# Patient Record
Sex: Male | Born: 1935 | Hispanic: No | Marital: Married | Smoking: Never smoker
Health system: Southern US, Community
[De-identification: ages and names within clinical notes are randomized; demographics above are authoritative.]

## PROBLEM LIST (undated history)

## (undated) DIAGNOSIS — E079 Disorder of thyroid, unspecified: Secondary | ICD-10-CM

## (undated) DIAGNOSIS — I10 Essential (primary) hypertension: Secondary | ICD-10-CM

## (undated) HISTORY — DX: Disorder of thyroid, unspecified: E07.9

## (undated) HISTORY — PX: HERNIA REPAIR: SHX51

## (undated) HISTORY — PX: CHOLECYSTECTOMY: SHX55

## (undated) HISTORY — DX: Essential (primary) hypertension: I10

---

## 2012-12-15 ENCOUNTER — Ambulatory Visit
Admission: RE | Admit: 2012-12-15 | Discharge: 2012-12-15 | Disposition: A | Discharge status: Home / Self Care | Payer: Self-pay | Source: Ambulatory Visit | Attending: Family Medicine | Admitting: Family Medicine

## 2012-12-15 ENCOUNTER — Ambulatory Visit: Payer: Self-pay | Attending: Internal Medicine | Admitting: Family Medicine

## 2012-12-15 ENCOUNTER — Encounter: Payer: Self-pay | Admitting: Family Medicine

## 2012-12-15 VITALS — BP 149/90 | HR 57 | Temp 97.7°F | Resp 16 | Ht 66.0 in | Wt 166.0 lb

## 2012-12-15 DIAGNOSIS — I10 Essential (primary) hypertension: Secondary | ICD-10-CM

## 2012-12-15 DIAGNOSIS — Z23 Encounter for immunization: Secondary | ICD-10-CM

## 2012-12-15 DIAGNOSIS — M722 Plantar fascial fibromatosis: Secondary | ICD-10-CM

## 2012-12-15 DIAGNOSIS — E039 Hypothyroidism, unspecified: Secondary | ICD-10-CM

## 2012-12-15 DIAGNOSIS — M79672 Pain in left foot: Secondary | ICD-10-CM

## 2012-12-15 DIAGNOSIS — M79609 Pain in unspecified limb: Secondary | ICD-10-CM | POA: Insufficient documentation

## 2012-12-15 LAB — CBC
HCT: 45.9 % (ref 39.0–52.0)
RDW: 15 % (ref 11.5–15.5)
WBC: 9.3 10*3/uL (ref 4.0–10.5)

## 2012-12-15 MED ORDER — LEVOTHYROXINE SODIUM 50 MCG PO TABS: 50.0000 ug | ORAL_TABLET | Freq: Every day | ORAL | Status: DC

## 2012-12-15 MED ORDER — BISOPROLOL FUMARATE 5 MG PO TABS: 5.0000 mg | ORAL_TABLET | Freq: Every day | ORAL | Status: DC

## 2012-12-15 MED ORDER — LORATADINE 10 MG PO TABS: 10.0000 mg | ORAL_TABLET | Freq: Every day | ORAL | Status: DC

## 2012-12-15 NOTE — Patient Instructions (Addendum)

## 2012-12-15 NOTE — Progress Notes (Signed)
PT HERE TO ESTABLISH CARE FOR HX HTN,THYROID DISEASE TAKING PRESCRIBED MEDS BUT NOT WITH HIM. SON WILL CALL WIFE WITH INFO SPEAKS ONLY ARABIC NEED BLOOD WORK C/O LEFT FOOT INTERMIT PAIN RADIATING TO HEEL X 3 DYS

## 2012-12-15 NOTE — Progress Notes (Signed)
Patient ID: Chavez Rosol, male   DOB: [birth year], 79 y.o.   MRN: [id number]  CC: left foot pain / establish care   Interpreter used to communicate directly with patient for entire encounter including providing detailed patient instructions  HPI: Patient is presenting as a new patient to the clinic today.  [place] with his son.  He reports that he's been having some difficulty with left heel pain.  He reports that he has pain with taking a step first thing in the morning.  He has pain with ambulating after he has been resting for a time.  He reports that he had this condition several years ago but it resolved.  He reports that he takes ibuprofen and that seems to help with the pain.  He has not been able to tolerate naproxen.  The patient also reports that he's had some runny nose and sneezing since he has relocated.  He reports that he normally has control blood pressure which she takes bisoprolol daily.  He reports that he takes the 5 mg dose.  He has a history of hypothyroidism and has been taking a stable dose of levothyroxine for quite a long time.  No Known Allergies Past Medical History  Diagnosis Date  . Thyroid disease   . Hypertension    No current outpatient prescriptions on file prior to visit.   No current facility-administered medications on file prior to visit.   History reviewed. No pertinent family history. History   Social History  . Marital Status: Single    Spouse Name: N/A    Number of Children: N/A  . Years of Education: N/A   Occupational History  . Not on file.   Social History Main Topics  . Smoking status: Never Smoker   . Smokeless tobacco: Not on file  . Alcohol Use: No  . Drug Use: No  . Sexual Activity: Not on file   Other Topics Concern  . Not on file   Social History Narrative  . No narrative on file    Review of Systems  Constitutional: Negative for fever, chills, diaphoresis, activity  change, appetite change and fatigue.  HENT: Negative for ear pain, nosebleeds, congestion, facial swelling, rhinorrhea, neck pain, neck stiffness and ear discharge.   Eyes: Negative for pain, discharge, redness, itching and visual disturbance.  Respiratory: Negative for cough, choking, chest tightness, shortness of breath, wheezing and stridor.   Cardiovascular: Negative for chest pain, palpitations and leg swelling.  Gastrointestinal: Negative for abdominal distention.  Genitourinary: Negative for dysuria, urgency, frequency, hematuria, flank pain, decreased urine volume, difficulty urinating and dyspareunia.  Musculoskeletal: Left foot pain and heel pain.  Negative for back pain, joint swelling, arthralgias and gait problem.  Neurological: Negative for dizziness, tremors, seizures, syncope, facial asymmetry, speech difficulty, weakness, light-headedness, numbness and headaches.  Hematological: Negative for adenopathy. Does not bruise/bleed easily.  Psychiatric/Behavioral: Negative for hallucinations, behavioral problems, confusion, dysphoric mood, decreased concentration and agitation.    Objective:   Filed Vitals:   12/15/12 1235  BP: 149/90  Pulse: 57  Temp: 97.7 F (36.5 C)  Resp: 16    Physical Exam  Constitutional: Appears well-developed and well-nourished. No distress.  HENT: Normocephalic. External right and left ear normal. Oropharynx is clear and moist.  Eyes: Conjunctivae and EOM are normal. PERRLA, no scleral icterus.  Neck: Normal ROM. Neck supple. No JVD. No tracheal deviation. No thyromegaly.  CVS: RRR, S1/S2 +, no murmurs, no gallops,  no carotid bruit.  Pulmonary: Effort and breath sounds normal, no stridor, rhonchi, wheezes, rales.  Abdominal: Soft. BS +,  no distension, tenderness, rebound or guarding.  Musculoskeletal: Painful left heel and left plantar fascia.  Normal range of motion. No edema and no tenderness.  Lymphadenopathy: No lymphadenopathy noted,  cervical, inguinal. Neuro: Alert. Normal reflexes, muscle tone coordination. No cranial nerve deficit. Skin: Skin is warm and dry. No rash noted. Not diaphoretic. No erythema. No pallor.  Psychiatric: Normal mood and affect. Behavior, judgment, thought content normal.   No results found for this basename: WBC, HGB, HCT, MCV, PLT   No results found for this basename: CREATININE, BUN, NA, K, CL, CO2    No results found for this basename: HGBA1C   Lipid Panel  No results found for this basename: chol, trig, hdl, cholhdl, vldl, ldlcalc     Assessment and plan:   Patient Active Problem List   Diagnosis Date Noted  . Plantar fasciitis 12/15/2012  . Unspecified essential hypertension 12/15/2012  . Unspecified hypothyroidism 12/15/2012  . Left foot pain 12/15/2012   I suspect this patient is suffering from plantar fasciitis.  I gave him some information regarding stretching exercises.  I also ordered an x-ray plain film of the left foot to rule out an occult fracture. We'll check labs today including a TSH.  Metabolic panel has been ordered as well.  We'll followup results.  We'll followup the x-ray results.    Return to clinic in 3 weeks for recheck and blood pressure recheck.    Flu vaccine provided today.   The patient was given clear instructions to go to ER or return to medical center if symptoms don't improve, worsen or new problems develop.  The patient verbalized understanding.  The patient was told to call to get any lab results if not heard anything in the next week.    Rodney Langton, MD, CDE, FAAFP [facility] [place], [place]

## 2012-12-16 ENCOUNTER — Other Ambulatory Visit: Payer: Self-pay | Admitting: Family Medicine

## 2012-12-16 DIAGNOSIS — M722 Plantar fascial fibromatosis: Secondary | ICD-10-CM

## 2012-12-16 DIAGNOSIS — M79672 Pain in left foot: Secondary | ICD-10-CM

## 2012-12-16 LAB — COMPLETE METABOLIC PANEL WITH GFR
ALT: 13 U/L (ref 0–53)
AST: 15 U/L (ref 0–37)
Albumin: 3.9 g/dL (ref 3.5–5.2)
Alkaline Phosphatase: 69 U/L (ref 39–117)
BUN: 12 mg/dL (ref 6–23)
Calcium: 9.1 mg/dL (ref 8.4–10.5)
Chloride: 103 mEq/L (ref 96–112)
GFR, Est African American: >89 mL/min
Potassium: 4.6 mEq/L (ref 3.5–5.3)
Sodium: 140 mEq/L (ref 135–145)
Total Protein: 6.6 g/dL (ref 6.0–8.3)

## 2012-12-16 LAB — TSH: TSH: 6.283 u[IU]/mL — ABNORMAL HIGH (ref 0.350–4.500)

## 2012-12-16 NOTE — Progress Notes (Signed)
Quick Note:  Please inform patient of labs came back revealing that the TSH level was mildly elevated. Please make sure the patient hasn't missed any doses of levothyroxine medication. I recommend rechecking TSH level in one month. At that time if it still elevated will need to increase the dose of levothyroxine. All other labs came back within normal limits.  Rodney Langton, MD, CDE, FAAFP [facility] [place], [place]   ______

## 2012-12-16 NOTE — Progress Notes (Signed)
Quick Note:  Please inform patient that x-ray of the left foot revealed the patient has a very large plantar calcaneal spur and mild osteoarthritis in the foot. I will make a referral for him to see the sports medicine center.   Rodney Langton, MD, CDE, FAAFP [facility] [place], [place]   ______

## 2012-12-21 ENCOUNTER — Telehealth: Payer: Self-pay | Admitting: Emergency Medicine

## 2012-12-21 NOTE — Telephone Encounter (Signed)
Message copied by Darlis Loan on Mon Dec 21, 2012  9:52 AM ------      Message from: Cleora Fleet      Created: Wed Dec 16, 2012  9:01 AM       Please inform patient of labs came back revealing that the TSH level was mildly elevated.  Please make sure the patient hasn't missed any doses of levothyroxine medication.  I recommend rechecking TSH level in one month.  At that time if it still elevated will need to increase the dose of levothyroxine.  All other labs came back within normal limits.            Rodney Langton, MD, CDE, FAAFP      [facility]      [facility]      [place], [place]        ------

## 2012-12-21 NOTE — Telephone Encounter (Signed)
Pt son given xray results. States father has appt with sports medicine

## 2012-12-21 NOTE — Telephone Encounter (Signed)
Message copied by Darlis Loan on Mon Dec 21, 2012 11:19 AM ------      Message from: Cleora Fleet      Created: Wed Dec 16, 2012  9:13 AM       Please inform patient that x-ray of the left foot revealed the patient has a very large plantar calcaneal spur and mild osteoarthritis in the foot.  I will make a referral for him to see the sports medicine center.              Rodney Langton, MD, CDE, FAAFP      [facility]      [facility]      [place], [place]        ------

## 2012-12-21 NOTE — Telephone Encounter (Signed)
Spoke with pt father and gave lab results. Son states father is taking Thyroid medication daily.appt already scheduled 01/07/13

## 2013-01-04 ENCOUNTER — Ambulatory Visit: Payer: Self-pay | Admitting: Sports Medicine

## 2013-01-07 ENCOUNTER — Ambulatory Visit: Payer: No Typology Code available for payment source | Attending: Internal Medicine | Admitting: Internal Medicine

## 2013-01-07 ENCOUNTER — Encounter: Payer: Self-pay | Admitting: Internal Medicine

## 2013-01-07 ENCOUNTER — Ambulatory Visit: Payer: Self-pay | Admitting: Internal Medicine

## 2013-01-07 VITALS — BP 119/80 | HR 70 | Temp 97.8°F | Resp 16 | Ht 67.0 in | Wt 164.0 lb

## 2013-01-07 DIAGNOSIS — I10 Essential (primary) hypertension: Secondary | ICD-10-CM | POA: Insufficient documentation

## 2013-01-07 DIAGNOSIS — M722 Plantar fascial fibromatosis: Secondary | ICD-10-CM

## 2013-01-07 DIAGNOSIS — E039 Hypothyroidism, unspecified: Secondary | ICD-10-CM | POA: Insufficient documentation

## 2013-01-07 MED ORDER — LORATADINE 10 MG PO TABS: 10.0000 mg | ORAL_TABLET | Freq: Every day | ORAL | Status: DC

## 2013-01-07 MED ORDER — BISOPROLOL FUMARATE 5 MG PO TABS: 5.0000 mg | ORAL_TABLET | Freq: Every day | ORAL | Status: DC

## 2013-01-07 MED ORDER — LEVOTHYROXINE SODIUM 50 MCG PO TABS: 50.0000 ug | ORAL_TABLET | Freq: Every day | ORAL | Status: DC

## 2013-01-07 NOTE — Progress Notes (Signed)
Pt here f/u thyroid levels with medications due to elevated tsh L foot pain- has scheduled appt with sports med for large spur on xray Son is interpretor for [place] language

## 2013-01-07 NOTE — Patient Instructions (Signed)

## 2013-01-07 NOTE — Progress Notes (Signed)
Patient ID: Anthony Hammond, male   DOB: 19-Oct-1929, 79 y.o.   MRN: [id number] Patient Demographics  Anthony Hammond, is a 79 y.o. male  WUJ:[id number]  [id number]  DOB - [birth year]  Chief Complaint  Patient presents with  . Follow-up  . Hypothyroidism        Subjective:   Anthony Hammond is a 79 y.o. male here today for a follow up visit. Patient has no new complaints, still awaiting sport medicine appointment. Patient claims pain is much improved. He needs a refill of his medications today, and to discuss the results of previous lab tests. She claims to be doing well at home, no sleeping, walking or eating difficulties. He does not smoke cigarette. Patient has No headache, No chest pain, No abdominal pain - No Nausea, No new weakness tingling or numbness, No Cough - SOB.  ALLERGIES: No Known Allergies  PAST MEDICAL HISTORY: Past Medical History  Diagnosis Date  . Thyroid disease   . Hypertension     MEDICATIONS AT HOME: Prior to Admission medications   Medication Sig Start Date End Date Taking? Authorizing Provider  bisoprolol (ZEBETA) 5 MG tablet Take 1 tablet (5 mg total) by mouth daily. 01/07/13  Yes Jeanann Lewandowsky, MD  levothyroxine (SYNTHROID, LEVOTHROID) 50 MCG tablet Take 1 tablet (50 mcg total) by mouth daily. 01/07/13  Yes Jeanann Lewandowsky, MD  loratadine (CLARITIN) 10 MG tablet Take 1 tablet (10 mg total) by mouth daily. 01/07/13  Yes Jeanann Lewandowsky, MD     Objective:   Filed Vitals:   01/07/13 1223  BP: 119/80  Pulse: 70  Temp: 97.8 F (36.6 C)  TempSrc: Oral  Resp: 16  Height: 5\' 7"  (1.702 m)  Weight: 164 lb (74.39 kg)  SpO2: 96%    Exam General appearance : Awake, alert, not in any distress. Speech Clear. Not toxic looking HEENT: Atraumatic and Normocephalic, pupils equally reactive to light and accomodation Neck: supple, no JVD. No cervical lymphadenopathy.  Chest:Good air entry bilaterally, no added sounds  CVS: S1 S2  regular, no murmurs.  Abdomen: Bowel sounds present, Non tender and not distended with no gaurding, rigidity or rebound. Extremities: B/L Lower Ext shows no edema, both legs are warm to touch Neurology: Awake alert, and oriented X 3, CN II-XII intact, Non focal Skin:No Rash Wounds:N/A   Data Review   CBC No results found for this basename: WBC, HGB, HCT, PLT, MCV, MCH, MCHC, RDW, NEUTRABS, LYMPHSABS, MONOABS, EOSABS, BASOSABS, BANDABS, BANDSABD,  in the last 168 hours  Chemistries   No results found for this basename: NA, K, CL, CO2, GLUCOSE, BUN, CREATININE, GFRCGP, CALCIUM, MG, AST, ALT, ALKPHOS, BILITOT,  in the last 168 hours ------------------------------------------------------------------------------------------------------------------ No results found for this basename: HGBA1C,  in the last 72 hours ------------------------------------------------------------------------------------------------------------------ No results found for this basename: CHOL, HDL, LDLCALC, TRIG, CHOLHDL, LDLDIRECT,  in the last 72 hours ------------------------------------------------------------------------------------------------------------------ No results found for this basename: TSH, T4TOTAL, FREET3, T3FREE, THYROIDAB,  in the last 72 hours ------------------------------------------------------------------------------------------------------------------ No results found for this basename: VITAMINB12, FOLATE, FERRITIN, TIBC, IRON, RETICCTPCT,  in the last 72 hours  Coagulation profile  No results found for this basename: INR, PROTIME,  in the last 168 hours    Assessment & Plan   Patient Active Problem List   Diagnosis Date Noted  . Plantar fasciitis 12/15/2012  . Unspecified essential hypertension 12/15/2012  . Unspecified hypothyroidism 12/15/2012  . Left foot pain 12/15/2012     Plan: Refill the following medications: Bisoprolol 5 mg tablet  by mouth daily Levothyroxine 50 mcg  tablet by mouth daily Loratadine 10 mg tablet by mouth daily  Patient has been counseled extensively about nutrition and exercise  Health Maintenance -Colonoscopy: Deferred -Vaccinations:  -Influenza already given  Follow up in 3 months or when necessary  Interpreter was used to communicate directly with patient for the entire encounter including providing detailed patient instructions.   The patient was given clear instructions to go to ER or return to medical center if symptoms don't improve, worsen or new problems develop. The patient verbalized understanding. The patient was told to call to get lab results if they haven't heard anything in the next week.    Jeanann Lewandowsky, MD, MHA, FACP, FAAP [facility] [place], [place] [phone]   01/07/2013, 12:54 PM

## 2013-02-22 ENCOUNTER — Emergency Department (HOSPITAL_COMMUNITY)
Admission: EM | Admit: 2013-02-22 | Discharge: 2013-02-22 | Disposition: A | Discharge status: Home / Self Care | Payer: No Typology Code available for payment source | Source: Home / Self Care | Attending: Family Medicine | Admitting: Family Medicine

## 2013-02-22 ENCOUNTER — Encounter (HOSPITAL_COMMUNITY): Payer: Self-pay | Admitting: Emergency Medicine

## 2013-02-22 DIAGNOSIS — K279 Peptic ulcer, site unspecified, unspecified as acute or chronic, without hemorrhage or perforation: Secondary | ICD-10-CM

## 2013-02-22 DIAGNOSIS — A048 Other specified bacterial intestinal infections: Secondary | ICD-10-CM

## 2013-02-22 MED ORDER — CLARITHROMYCIN 500 MG PO TABS: 500.0000 mg | ORAL_TABLET | Freq: Two times a day (BID) | ORAL | Status: DC

## 2013-02-22 MED ORDER — OMEPRAZOLE 20 MG PO CPDR: 20.0000 mg | DELAYED_RELEASE_CAPSULE | Freq: Two times a day (BID) | ORAL | Status: DC

## 2013-02-22 MED ORDER — AMOXICILLIN 500 MG PO CAPS: 1000.0000 mg | ORAL_CAPSULE | Freq: Two times a day (BID) | ORAL | Status: DC

## 2013-02-22 NOTE — ED Notes (Signed)
C/o abd pain States he had a heel spur and was given naproxen from his son and feels like the sx started then States a year ago he was dx with a hernia Denies vomiting, diarrhea, and appetite States he is unable to lay down with out pain

## 2013-02-22 NOTE — ED Provider Notes (Signed)
CSN: [id number]     Arrival date & time 02/22/13  0935 History   None    Chief Complaint  Patient presents with  . Abdominal Pain   (Consider location/radiation/quality/duration/timing/severity/associated sxs/prior Treatment) HPI Comments: Pt with epigastric abd pain for 4 weeks. Started when he took some naproxen for heel pain. Only took med for 2 days because it caused abd pain. Pain is constant, but worse with eating and lying supine. [place], tx with omeprazole for 3 months for complete resolution of sx.   Patient is a 79 y.o. male presenting with abdominal pain. The history is provided by the patient and a relative. A language interpreter was used (son interpreted for pt).  Abdominal Pain This is a new problem. Episode onset: 4 weeks ago. The problem occurs constantly. The problem has not changed since onset.Associated symptoms include abdominal pain. The symptoms are aggravated by eating (lying down). Nothing relieves the symptoms. He has tried nothing for the symptoms.    Past Medical History  Diagnosis Date  . Thyroid disease   . Hypertension    Past Surgical History  Procedure Laterality Date  . Cholecystectomy     History reviewed. No pertinent family history. History  Substance Use Topics  . Smoking status: Never Smoker   . Smokeless tobacco: Not on file  . Alcohol Use: No    Review of Systems  Constitutional: Negative for fever and chills.  Gastrointestinal: Positive for abdominal pain and constipation. Negative for nausea, vomiting, diarrhea and blood in stool.    Allergies  Review of patient's allergies indicates no known allergies.  Home Medications   Current Outpatient Rx  Name  Route  Sig  Dispense  Refill  . amoxicillin (AMOXIL) 500 MG capsule   Oral   Take 2 capsules (1,000 mg total) by mouth 2 (two) times daily.   56 capsule   0   . bisoprolol (ZEBETA) 5 MG tablet   Oral   Take 1 tablet (5 mg total) by mouth  daily.   90 tablet   3   . clarithromycin (BIAXIN) 500 MG tablet   Oral   Take 1 tablet (500 mg total) by mouth 2 (two) times daily.   28 tablet   0   . levothyroxine (SYNTHROID, LEVOTHROID) 50 MCG tablet   Oral   Take 1 tablet (50 mcg total) by mouth daily.   90 tablet   3   . loratadine (CLARITIN) 10 MG tablet   Oral   Take 1 tablet (10 mg total) by mouth daily.   30 tablet   3   . omeprazole (PRILOSEC) 20 MG capsule   Oral   Take 1 capsule (20 mg total) by mouth 2 (two) times daily before a meal.   60 capsule   2    BP 137/87  Pulse 75  Temp(Src) 98.4 F (36.9 C) (Oral)  Resp 20  SpO2 96% Physical Exam  Constitutional: He appears well-developed and well-nourished. No distress.  Cardiovascular: Normal rate and regular rhythm.   Pulmonary/Chest: Effort normal and breath sounds normal.  Abdominal: Normal appearance and bowel sounds are normal. He exhibits no distension. There is no hepatosplenomegaly. There is tenderness in the right upper quadrant, epigastric area and left upper quadrant. There is no rigidity, no rebound and no guarding.  Pain is primarily epigastric, but pt also feels some discomfort in BUQ    ED Course  Procedures (including critical care time) Labs Review  Labs Reviewed  POCT H PYLORI SCREEN - Abnormal; Notable for the following:    H. PYLORI SCREEN, POC POSITIVE (*)    All other components within normal limits   Imaging Review No results found.  EKG Interpretation    Date/Time:    Ventricular Rate:    PR Interval:    QRS Duration:   QT Interval:    QTC Calculation:   R Axis:     Text Interpretation:              MDM   1. Peptic ulcer disease   2. H. pylori infection   rx clarithyromycin 500mg  BID #28, amoxicillin 500mg  2 po BID #56 and omeprazole 20mg  BID #60 2 refills. Pt to f/u with pcp in one month for recheck.      Cathlyn Parsons, NP 02/22/13 1214

## 2013-02-23 NOTE — ED Provider Notes (Signed)
Medical screening examination/treatment/procedure(s) were performed by a resident physician or non-physician practitioner and as the supervising physician I was immediately available for consultation/collaboration.  Clementeen Graham, MD   Rodolph Bong, MD 02/23/13 [phone]

## 2013-04-12 ENCOUNTER — Ambulatory Visit: Payer: No Typology Code available for payment source | Attending: Internal Medicine | Admitting: Internal Medicine

## 2013-04-12 ENCOUNTER — Encounter: Payer: Self-pay | Admitting: Internal Medicine

## 2013-04-12 ENCOUNTER — Ambulatory Visit: Payer: Self-pay | Admitting: Internal Medicine

## 2013-04-12 VITALS — BP 135/84 | HR 91 | Temp 98.4°F | Resp 16 | Wt 160.0 lb

## 2013-04-12 DIAGNOSIS — E039 Hypothyroidism, unspecified: Secondary | ICD-10-CM

## 2013-04-12 DIAGNOSIS — R1013 Epigastric pain: Secondary | ICD-10-CM

## 2013-04-12 DIAGNOSIS — I10 Essential (primary) hypertension: Secondary | ICD-10-CM

## 2013-04-12 NOTE — Progress Notes (Signed)
Patient here for follow up States was at the urgent care about 6 weeks ago Diagnosed with and ulcer Has been having similar symptoms again- stomach pain and Decreased appetite Will need referral to GI

## 2013-04-12 NOTE — Progress Notes (Signed)
MRN: [id number] Name: Anthony Hammond  Sex: male Age: 80 y.o. DOB: 1929-09-27  Allergies: Review of patient's allergies indicates no known allergies.  Chief Complaint  Patient presents with  . Follow-up    HPI: Patient is 80 y.o. male who comes today for followup history of hypertension hypothyroidism and has been taking his medications, 6 weeks ago he was seen in the urgent care with symptoms of epigastric pain  found to have H. pylori positive, already treated with 2 weeks of triple regimen, as per patient after finishing medication he gets symptoms more often, he had refill on Prilosec but did not get prescription filled, denies any change in bowel habits.  Past Medical History  Diagnosis Date  . Thyroid disease   . Hypertension     Past Surgical History  Procedure Laterality Date  . Cholecystectomy        Medication List       This list is accurate as of: 04/12/13  5:24 PM.  Always use your most recent med list.               amoxicillin 500 MG capsule  Commonly known as:  AMOXIL  Take 2 capsules (1,000 mg total) by mouth 2 (two) times daily.     bisoprolol 5 MG tablet  Commonly known as:  ZEBETA  Take 1 tablet (5 mg total) by mouth daily.     clarithromycin 500 MG tablet  Commonly known as:  BIAXIN  Take 1 tablet (500 mg total) by mouth 2 (two) times daily.     levothyroxine 50 MCG tablet  Commonly known as:  SYNTHROID, LEVOTHROID  Take 1 tablet (50 mcg total) by mouth daily.     loratadine 10 MG tablet  Commonly known as:  CLARITIN  Take 1 tablet (10 mg total) by mouth daily.     omeprazole 20 MG capsule  Commonly known as:  PRILOSEC  Take 1 capsule (20 mg total) by mouth 2 (two) times daily before a meal.        No orders of the defined types were placed in this encounter.    Immunization History  Administered Date(s) Administered  . Influenza Split 12/15/2012    History reviewed. No pertinent family history.  History  Substance Use  Topics  . Smoking status: Never Smoker   . Smokeless tobacco: Not on file  . Alcohol Use: No    Review of Systems  As noted in HPI  Filed Vitals:   04/12/13 1703  BP: 135/84  Pulse: 91  Temp: 98.4 F (36.9 C)  Resp: 16    Physical Exam  Physical Exam  Eyes: EOM are normal. Pupils are equal, round, and reactive to light.  Cardiovascular: Normal rate and regular rhythm.   Pulmonary/Chest: Breath sounds normal. No respiratory distress. He has no wheezes.  Abdominal:  Minimal epigastric tenderness no rebound or guarding bowel sounds positive  Neurological: He has normal reflexes.    CBC    Component Value Date/Time   WBC 9.3 12/15/2012 1257   RBC 5.43 12/15/2012 1257   HGB 15.6 12/15/2012 1257   HCT 45.9 12/15/2012 1257   PLT 255 12/15/2012 1257   MCV 84.5 12/15/2012 1257    CMP     Component Value Date/Time   NA 140 12/15/2012 1257   K 4.6 12/15/2012 1257   CL 103 12/15/2012 1257   CO2 30 12/15/2012 1257   GLUCOSE 84 12/15/2012 1257   BUN 12 12/15/2012 1257   CREATININE  0.89 12/15/2012 1257   CALCIUM 9.1 12/15/2012 1257   PROT 6.6 12/15/2012 1257   ALBUMIN 3.9 12/15/2012 1257   AST 15 12/15/2012 1257   ALT 13 12/15/2012 1257   ALKPHOS 69 12/15/2012 1257   BILITOT 0.4 12/15/2012 1257    No results found for this basename: chol, tri, ldl    No components found with this basename: hga1c    Lab Results  Component Value Date/Time   AST 15 12/15/2012 12:57 PM    Assessment and Plan  Abdominal pain, epigastric - Plan: Patient will continued Prilosec  Ambulatory referral to Gastroenterology  Unspecified hypothyroidism - Plan: TSH Continue with levothyroxine 50 mcg daily.  Unspecified essential hypertension Blood pressure is well controlled continue with bisoprolol. Also advised for low-salt diet.   Return in about 3 months (around 07/11/2013).  Doris CheadleADVANI, Ersa Delaney, MD

## 2013-04-13 ENCOUNTER — Telehealth: Payer: Self-pay | Admitting: *Deleted

## 2013-04-13 LAB — TSH: TSH: 3.998 u[IU]/mL (ref 0.350–4.500)

## 2013-04-13 NOTE — Telephone Encounter (Signed)
Message copied by Raynelle CharyWINFREE, Riven Mabile R on Tue Apr 13, 2013 11:17 AM ------      Message from: Doris CheadleADVANI, DEEPAK      Created: Tue Apr 13, 2013 11:03 AM       Blood work reviewed, TSH level is within normal range, call and advise patient to continue with same dose of levothyroxine 50 mcg daily. ------

## 2013-04-13 NOTE — Telephone Encounter (Signed)
I called pt and informed him of his lab results. Pt was also concerned about the test we did showing what could be his stomach problems. I told the pt that the doctor has not review that test yet. I told him as soon as the doctor reviews the test that I would give him another call.

## 2013-04-14 ENCOUNTER — Telehealth: Payer: Self-pay

## 2013-04-14 ENCOUNTER — Encounter: Payer: Self-pay | Admitting: Gastroenterology

## 2013-04-14 NOTE — Telephone Encounter (Signed)
Patient is aware of her lab results 

## 2013-04-14 NOTE — Telephone Encounter (Signed)
Message copied by Lestine MountJUAREZ, Foxx Klarich L on Wed Apr 14, 2013  5:01 PM ------      Message from: Doris CheadleADVANI, DEEPAK      Created: Tue Apr 13, 2013 11:03 AM       Blood work reviewed, TSH level is within normal range, call and advise patient to continue with same dose of levothyroxine 50 mcg daily. ------

## 2013-04-19 ENCOUNTER — Ambulatory Visit: Payer: No Typology Code available for payment source | Attending: Internal Medicine

## 2013-05-11 ENCOUNTER — Ambulatory Visit (INDEPENDENT_AMBULATORY_CARE_PROVIDER_SITE_OTHER): Payer: No Typology Code available for payment source | Admitting: Gastroenterology

## 2013-05-11 ENCOUNTER — Encounter: Payer: Self-pay | Admitting: Gastroenterology

## 2013-05-11 VITALS — BP 110/54 | HR 70 | Ht 67.0 in | Wt 160.0 lb

## 2013-05-11 DIAGNOSIS — R1013 Epigastric pain: Secondary | ICD-10-CM

## 2013-05-11 DIAGNOSIS — G8929 Other chronic pain: Secondary | ICD-10-CM

## 2013-05-11 NOTE — Progress Notes (Signed)
HPI: This is a    very pleasant [place] man who is here with his son today.  He presented to urgent clinic 02/2013, blood test + H. Pylori serology and he was put on abx.  He was having epigastric pains, some mild nausea. The pains would even awaken him at night.  About a year ago in [place] he was told he had an ulcer and a hernia (hiatal?).    Bone spur in heel.  Son gave him NSAIDs, 3 months ago; ibuprofen for a day or two, then naproxen for  Days. No other OTC pain meds.  He began to have epigastric pain in November.  H. Pylori treatment and PPI BID.  He stopped the PPI, pain returned.  Then restarted PPI once daily 3-4 weeks ago and he feels abosolutely fine.  Overall his weight has been stable.  No vomiting.  No dypshagia.    Pyrosis occasionally.    Has seen no overt GI bleeding.    Review of systems: Pertinent positive and negative review of systems were noted in the above HPI section. Complete review of systems was performed and was otherwise normal.    Past Medical History  Diagnosis Date  . Thyroid disease   . Hypertension     Past Surgical History  Procedure Laterality Date  . Cholecystectomy      Current Outpatient Prescriptions  Medication Sig Dispense Refill  . bisoprolol (ZEBETA) 5 MG tablet Take 1 tablet (5 mg total) by mouth daily.  90 tablet  3  . levothyroxine (SYNTHROID, LEVOTHROID) 50 MCG tablet Take 1 tablet (50 mcg total) by mouth daily.  90 tablet  3  . omeprazole (PRILOSEC) 20 MG capsule Take 1 capsule (20 mg total) by mouth 2 (two) times daily before a meal.  60 capsule  2   No current facility-administered medications for this visit.    Allergies as of 05/11/2013  . (No Known Allergies)    Family History  Problem Relation Age of Onset  . Celiac disease Son   . Diabetes Brother     History   Social History  . Marital Status: Married    Spouse Name: N/A    Number of Children: N/A  . Years of Education: N/A   Occupational  History  . Not on file.   Social History Main Topics  . Smoking status: Never Smoker   . Smokeless tobacco: Not on file  . Alcohol Use: No  . Drug Use: No  . Sexual Activity: Not on file   Other Topics Concern  . Not on file   Social History Narrative  . No narrative on file       Physical Exam: BP 110/54  Pulse 70  Ht 5\' 7"  (1.702 m)  Wt 160 lb (72.576 kg)  BMI 25.05 kg/m2 Constitutional: generally well-appearing Psychiatric: alert and oriented x3 Eyes: extraocular movements intact Mouth: oral pharynx moist, no lesions Neck: supple no lymphadenopathy Cardiovascular: heart regular rate and rhythm Lungs: clear to auscultation bilaterally Abdomen: soft, nontender, nondistended, no obvious ascites, no peritoneal signs, normal bowel sounds Extremities: no lower extremity edema bilaterally Skin: no lesions on visible extremities    Assessment and plan: 80 y.o. male with  epigastric pain and proved after H. pylori treatment and daily proton pump inhibitor  I suspect he does have or did have gastritis, perhaps peptic ulcer disease as well. He is not interested in any further testing at this point. He feels well on proton pump inhibitor once  daily, he is completely back to normal. He is not losing weight, has no signs of GI bleeding. I think it is fine to simply treat him with proton pump inhibitor but he knows to call here if the pains return at that point I would likely recommend we proceed with upper endoscopy.

## 2013-05-11 NOTE — Patient Instructions (Signed)
You should keep taking omeprazole one pill once daily. If your pains return, please call and would likely proceed with upper endoscopy.

## 2013-07-13 ENCOUNTER — Ambulatory Visit: Payer: Self-pay

## 2014-05-05 ENCOUNTER — Other Ambulatory Visit: Payer: Self-pay | Admitting: Internal Medicine

## 2014-05-11 ENCOUNTER — Telehealth: Payer: Self-pay | Admitting: Internal Medicine

## 2014-05-11 NOTE — Telephone Encounter (Signed)
Patient has come n today to schedule an appointment with a provider to re-establish care of his HBP; Patient is out of two prescriptions bisoprolol (ZEBETA) 5 MG tablet & omeprazole (PRILOSEC) 20 MG capsule; please f/u with patient

## 2014-05-12 ENCOUNTER — Ambulatory Visit: Payer: Self-pay | Attending: Family Medicine | Admitting: Family Medicine

## 2014-05-12 ENCOUNTER — Encounter: Payer: Self-pay | Admitting: Family Medicine

## 2014-05-12 VITALS — BP 115/77 | HR 76 | Temp 97.8°F | Resp 16 | Ht 66.5 in | Wt 154.0 lb

## 2014-05-12 DIAGNOSIS — M25511 Pain in right shoulder: Secondary | ICD-10-CM | POA: Insufficient documentation

## 2014-05-12 DIAGNOSIS — Z0000 Encounter for general adult medical examination without abnormal findings: Secondary | ICD-10-CM

## 2014-05-12 DIAGNOSIS — Z23 Encounter for immunization: Secondary | ICD-10-CM | POA: Insufficient documentation

## 2014-05-12 DIAGNOSIS — K0889 Other specified disorders of teeth and supporting structures: Secondary | ICD-10-CM

## 2014-05-12 DIAGNOSIS — I10 Essential (primary) hypertension: Secondary | ICD-10-CM | POA: Insufficient documentation

## 2014-05-12 DIAGNOSIS — K088 Other specified disorders of teeth and supporting structures: Secondary | ICD-10-CM | POA: Insufficient documentation

## 2014-05-12 MED ORDER — ACETAMINOPHEN ER 650 MG PO TBCR: 650.0000 mg | EXTENDED_RELEASE_TABLET | Freq: Three times a day (TID) | ORAL | Status: DC | PRN

## 2014-05-12 MED ORDER — ZOSTER VACCINE LIVE 19400 UNT/0.65ML ~~LOC~~ SOLR: 0.6500 mL | Freq: Once | SUBCUTANEOUS | Status: DC

## 2014-05-12 MED ORDER — OMEPRAZOLE 20 MG PO CPDR: 20.0000 mg | DELAYED_RELEASE_CAPSULE | Freq: Two times a day (BID) | ORAL | Status: DC

## 2014-05-12 MED ORDER — BISOPROLOL FUMARATE 5 MG PO TABS: 5.0000 mg | ORAL_TABLET | Freq: Every day | ORAL | Status: DC

## 2014-05-12 NOTE — Assessment & Plan Note (Signed)
A: hx of HTN, normotensive on BB. No history of MI or CAD Med: compliant P: We discussed that BB is not first line treatment but patient would like to continue Refilled low dose BB

## 2014-05-12 NOTE — Progress Notes (Signed)
PI ID # [id number]  Patient here to f/u on his HTN Patient complains of left lower tooth pain patient felt something "break off" when he was eating Patient's son reports patient had abscess and his relatives gave him clamoxyl that they had left over Patient needs refill on Prilosec

## 2014-05-12 NOTE — Patient Instructions (Addendum)
Mr. Anthony Hammond,  Thank you for coming in today. It was a pleasure meeting you. I look forward to being your primary doctor.   1. Dental pain: no abscess currently. Referral placed.  2. L shoulder pain: I suspect bursitis or DJD Recommend tylenol 650 mg every 8 hrs as needed.  Sports medicine referral  3. HTN:  BP well controlled  refilled bisoprolol   4. Healthcare maintenance Flu and prevnar 13 today, prevnar 23 in 6-12 months  GI referral for screening colonoscopy  zostavax prescription  F/u in 6 months for prevnar 23., nurse visit. See me in one year for HTN.   Dr. Armen PickupFunches

## 2014-05-12 NOTE — Assessment & Plan Note (Signed)
A: due for colonoscopy, flu, pneumovax (unsure of immunization status), zostavax  P: Flu and prevnar 13 today, prevnar 23 in 6-12 months  GI referral for screening colonoscopy  zostavax prescription

## 2014-05-12 NOTE — Assessment & Plan Note (Signed)
A: R shoulder pain with adduction suspect bursitis vs DJD P: Tylenol 650 mg TID prn [facility] and possible shoulder injection

## 2014-05-13 NOTE — Assessment & Plan Note (Signed)
1. Dental pain: no abscess currently. Referral placed.

## 2014-05-13 NOTE — Progress Notes (Signed)
   Subjective:    Patient ID: Anthony Hammond, male    DOB: 08/22/1929, 81 y.o.   MRN: [id number] CC: HTN f/u tooth pain, shoulder pain  HPI 81 yo M:  1. HTN: compliant with BB. No fatigue, CP, SOB.   2. Chipped tooth: a few weeks ago. No oral swelling or fever. No dental home.  3. L shoulder pain: x many years. Worse with lifting arm. Limiting ROM. No trauma. No home therapy.   Soc Hx: non smoker  Review of Systems As per HPI     Objective:   Physical Exam BP 115/77 mmHg  Pulse 76  Temp(Src) 97.8 F (36.6 C)  Resp 16  Ht 5' 6.5" (1.689 m)  Wt 154 lb (69.854 kg)  BMI 24.49 kg/m2  SpO2 96% General appearance: alert, cooperative and no distress Throat: lower partials, poor dentition, chipped tooth  Neck: no adenopathy, supple, symmetrical, trachea midline and thyroid not enlarged, symmetric, no tenderness/mass/nodules Lungs: clear to auscultation bilaterally Heart: regular rate and rhythm, S1, S2 normal, no murmur, click, rub or gallop Extremities: extremities normal, atraumatic, no cyanosis or edema  Pain with adduction of shoulders        Assessment & Plan:

## 2014-05-25 ENCOUNTER — Ambulatory Visit: Payer: Self-pay | Admitting: Sports Medicine

## 2014-05-26 ENCOUNTER — Ambulatory Visit: Payer: Self-pay | Attending: Internal Medicine

## 2014-07-20 ENCOUNTER — Other Ambulatory Visit: Payer: Self-pay | Admitting: *Deleted

## 2014-07-20 MED ORDER — OMEPRAZOLE 20 MG PO CPDR: 20.0000 mg | DELAYED_RELEASE_CAPSULE | Freq: Two times a day (BID) | ORAL | Status: DC

## 2014-09-05 ENCOUNTER — Ambulatory Visit: Payer: Self-pay | Attending: Family Medicine | Admitting: Family Medicine

## 2014-09-05 ENCOUNTER — Encounter: Payer: Self-pay | Admitting: Family Medicine

## 2014-09-05 VITALS — BP 135/81 | HR 72 | Temp 98.0°F | Resp 16 | Ht 66.5 in | Wt 153.0 lb

## 2014-09-05 DIAGNOSIS — R109 Unspecified abdominal pain: Secondary | ICD-10-CM | POA: Insufficient documentation

## 2014-09-05 DIAGNOSIS — M26629 Arthralgia of temporomandibular joint, unspecified side: Secondary | ICD-10-CM | POA: Insufficient documentation

## 2014-09-05 DIAGNOSIS — M25511 Pain in right shoulder: Secondary | ICD-10-CM

## 2014-09-05 DIAGNOSIS — R1032 Left lower quadrant pain: Secondary | ICD-10-CM

## 2014-09-05 DIAGNOSIS — M2660 Temporomandibular joint disorder, unspecified: Secondary | ICD-10-CM | POA: Insufficient documentation

## 2014-09-05 DIAGNOSIS — M2669 Other specified disorders of temporomandibular joint: Secondary | ICD-10-CM

## 2014-09-05 MED ORDER — ACETAMINOPHEN ER 650 MG PO TBCR: 650.0000 mg | EXTENDED_RELEASE_TABLET | Freq: Three times a day (TID) | ORAL | Status: AC | PRN

## 2014-09-05 MED ORDER — IBUPROFEN 600 MG PO TABS: 600.0000 mg | ORAL_TABLET | Freq: Three times a day (TID) | ORAL | Status: DC | PRN

## 2014-09-05 NOTE — Patient Instructions (Signed)
Mr. Tolmie,  Thank you for coming in today  1. L jaw pain: TMJ Short course of ibuprofen Soft diet- avoid hard foods Referral to dentist placed again   2. L lower abdominal pain with walking: I do not feel a definite hernia Tylenol for pain If you develop bulging or severe pain I will obtain imaging and refer to surgery   F/u in 3 months for hypertension, hypothyroidism and flu shot   Dr. Armen Pickup   Temporomandibular Problems  Temporomandibular joint (TMJ) dysfunction means there are problems with the joint between your jaw and your skull. This is a joint lined by cartilage like other joints in your body but also has a small disc in the joint which keeps the bones from rubbing on each other. These joints are like other joints and can get inflamed (sore) from arthritis and other problems. When this joint gets sore, it can cause headaches and pain in the jaw and the face. CAUSES  Usually the arthritic types of problems are caused by soreness in the joint. Soreness in the joint can also be caused by overuse. This may come from grinding your teeth. It may also come from mis-alignment in the joint. DIAGNOSIS Diagnosis of this condition can often be made by history and exam. Sometimes your caregiver may need X-rays or an MRI scan to determine the exact cause. It may be necessary to see your dentist to determine if your teeth and jaws are lined up correctly. TREATMENT  Most of the time this problem is not serious; however, sometimes it can persist (become chronic). When this happens medications that will cut down on inflammation (soreness) help. Sometimes a shot of cortisone into the joint will be helpful. If your teeth are not aligned it may help for your dentist to make a splint for your mouth that can help this problem. If no physical problems can be found, the problem may come from tension. If tension is found to be the cause, biofeedback or relaxation techniques may be helpful. HOME CARE  INSTRUCTIONS   Later in the day, applications of ice packs may be helpful. Ice can be used in a plastic bag with a towel around it to prevent frostbite to skin. This may be used about every 2 hours for 20 to 30 minutes, as needed while awake, or as directed by your caregiver.  Only take over-the-counter or prescription medicines for pain, discomfort, or fever as directed by your caregiver.  If physical therapy was prescribed, follow your caregiver's directions.  Wear mouth appliances as directed if they were given. Document Released: 11/27/2000 Document Revised: 05/27/2011 Document Reviewed: 03/06/2008 [facility] Patient Information 2015 [place], [place]. This information is not intended to replace advice given to you by your health care provider. Make sure you discuss any questions you have with your health care provider.

## 2014-09-05 NOTE — Progress Notes (Signed)
F/U HTN Complaining jaw pain x 10 days Hx Bell Palsy  Hip area pain worsen with walk x 5 month hx hernia    Used [employer] Interpreted Arabic Virgel Bouquet

## 2014-09-05 NOTE — Assessment & Plan Note (Signed)
L jaw pain: TMJ Short course of ibuprofen Soft diet- avoid hard foods Referral to dentist placed again

## 2014-09-05 NOTE — Assessment & Plan Note (Signed)
L lower abdominal pain with walking: I do not feel a definite hernia Tylenol for pain If you develop bulging or severe pain I will obtain imaging and refer to surgery

## 2014-09-05 NOTE — Progress Notes (Signed)
   Subjective:    Patient ID: Anthony Hammond, male    DOB: [birth year], 81 y.o.   MRN: [id number] CC: jaw pain and hip pain  Arabic interpreter present  HPI  1. L sided jaw pain: x 10 days. No injury. No neck pain. Some clicking. Has not seen dentist. No jaw swelling.   2. LL abdominal pain: pain off and on in LLQ with walking. No bulging. Has hx of RLQ hernia s/p repair. No injury.   Soc Hx: non smoker  Review of Systems  HENT: Positive for dental problem. Negative for ear discharge and ear pain.   Gastrointestinal: Positive for abdominal pain. Negative for nausea, vomiting, diarrhea, constipation and abdominal distention.      Objective:   Physical Exam BP 135/81 mmHg  Pulse 72  Temp(Src) 98 F (36.7 C) (Oral)  Resp 16  Ht 5' 6.5" (1.689 m)  Wt 153 lb (69.4 kg)  BMI 24.33 kg/m2  SpO2 97%  Wt Readings from Last 3 Encounters:  09/05/14 153 lb (69.4 kg)  05/12/14 154 lb (69.854 kg)  05/11/13 160 lb (72.576 kg)  General appearance: alert, cooperative and no distress  Ears: wax in R canal, normal TM. Normal L canal and TM Throat: normal findings: oropharynx pink & moist without lesions or evidence of thrush and abnormal findings: dentition: poor  Neck: no adenopathy, supple, symmetrical, trachea midline and thyroid not enlarged, symmetric, no tenderness/mass/nodules Abdomen: healed RL abdomen surgical scar, LL abdomen, no mass or bulging. Softness in L lower abdominal wall.      Assessment & Plan:

## 2014-09-29 ENCOUNTER — Other Ambulatory Visit: Payer: Self-pay | Admitting: Internal Medicine

## 2014-09-29 ENCOUNTER — Other Ambulatory Visit: Payer: Self-pay | Admitting: *Deleted

## 2014-09-29 MED ORDER — LEVOTHYROXINE SODIUM 50 MCG PO TABS: 50.0000 ug | ORAL_TABLET | Freq: Every day | ORAL | Status: DC

## 2014-09-29 MED ORDER — OMEPRAZOLE 20 MG PO CPDR: 20.0000 mg | DELAYED_RELEASE_CAPSULE | Freq: Two times a day (BID) | ORAL | Status: DC

## 2014-09-29 NOTE — Telephone Encounter (Signed)
Patient's son came into facility to request med refills for bisoprolol (ZEBETA) 5 MG tablet, levothyroxine (SYNTHROID, LEVOTHROID) 50 MCG tablet, and omeprazole (PRILOSEC) 20 MG capsule. Please f/u with pt.

## 2014-09-30 ENCOUNTER — Other Ambulatory Visit: Payer: Self-pay | Admitting: Family Medicine

## 2014-09-30 DIAGNOSIS — E039 Hypothyroidism, unspecified: Secondary | ICD-10-CM

## 2014-10-06 ENCOUNTER — Ambulatory Visit: Payer: Self-pay | Attending: Family Medicine

## 2014-10-06 DIAGNOSIS — E039 Hypothyroidism, unspecified: Secondary | ICD-10-CM

## 2014-10-06 LAB — TSH: TSH: 6.284 u[IU]/mL — ABNORMAL HIGH (ref 0.350–4.500)

## 2014-10-07 ENCOUNTER — Other Ambulatory Visit: Payer: Self-pay | Admitting: Family Medicine

## 2014-10-07 DIAGNOSIS — E039 Hypothyroidism, unspecified: Secondary | ICD-10-CM

## 2014-10-07 MED ORDER — LEVOTHYROXINE SODIUM 75 MCG PO TABS: 75.0000 ug | ORAL_TABLET | Freq: Every day | ORAL | Status: DC

## 2014-10-07 NOTE — Assessment & Plan Note (Signed)
TSH elevated  Increase synthroid to 75 mcg daily  Repeat TSH in 12 weeks, ordered

## 2014-10-17 ENCOUNTER — Telehealth: Payer: Self-pay | Admitting: *Deleted

## 2014-10-17 NOTE — Telephone Encounter (Signed)
-----   Message from Dessa Phi, MD sent at 10/07/2014  1:15 PM EDT ----- TSH elevated  Increase synthroid to 75 mcg daily  Repeat TSH in 12 weeks, ordered

## 2014-10-17 NOTE — Telephone Encounter (Signed)
Used [employer] Interpreted Arabic [phone]  LVM to return call

## 2015-01-05 ENCOUNTER — Ambulatory Visit: Payer: Self-pay | Attending: Family Medicine | Admitting: Pharmacist

## 2015-01-05 DIAGNOSIS — Z23 Encounter for immunization: Secondary | ICD-10-CM | POA: Insufficient documentation

## 2015-01-05 MED ORDER — INFLUENZA VAC SPLIT QUAD 0.5 ML IM SUSY
0.5000 mL | PREFILLED_SYRINGE | Freq: Once | INTRAMUSCULAR | Status: AC
  Administered 2015-01-05: 0.5 mL via INTRAMUSCULAR

## 2015-01-05 NOTE — Patient Instructions (Signed)

## 2015-12-13 ENCOUNTER — Ambulatory Visit: Payer: Self-pay | Attending: Internal Medicine

## 2015-12-21 ENCOUNTER — Ambulatory Visit (HOSPITAL_COMMUNITY)
Admission: RE | Admit: 2015-12-21 | Discharge: 2015-12-21 | Disposition: A | Discharge status: Home / Self Care | Payer: Self-pay | Source: Ambulatory Visit | Attending: Family Medicine | Admitting: Family Medicine

## 2015-12-21 ENCOUNTER — Ambulatory Visit: Payer: Self-pay | Attending: Family Medicine | Admitting: Family Medicine

## 2015-12-21 ENCOUNTER — Encounter: Payer: Self-pay | Admitting: Family Medicine

## 2015-12-21 VITALS — BP 127/83 | HR 60 | Temp 97.8°F | Ht 66.5 in | Wt 154.0 lb

## 2015-12-21 DIAGNOSIS — Z9841 Cataract extraction status, right eye: Secondary | ICD-10-CM

## 2015-12-21 DIAGNOSIS — R102 Pelvic and perineal pain: Secondary | ICD-10-CM | POA: Insufficient documentation

## 2015-12-21 DIAGNOSIS — E039 Hypothyroidism, unspecified: Secondary | ICD-10-CM

## 2015-12-21 DIAGNOSIS — I10 Essential (primary) hypertension: Secondary | ICD-10-CM

## 2015-12-21 DIAGNOSIS — R6884 Jaw pain: Secondary | ICD-10-CM | POA: Insufficient documentation

## 2015-12-21 DIAGNOSIS — K219 Gastro-esophageal reflux disease without esophagitis: Secondary | ICD-10-CM

## 2015-12-21 DIAGNOSIS — Z23 Encounter for immunization: Secondary | ICD-10-CM

## 2015-12-21 MED ORDER — OMEPRAZOLE 20 MG PO CPDR: 20.0000 mg | DELAYED_RELEASE_CAPSULE | Freq: Two times a day (BID) | ORAL | 5 refills | Status: DC

## 2015-12-21 MED ORDER — BISOPROLOL FUMARATE 5 MG PO TABS: 5.0000 mg | ORAL_TABLET | Freq: Every day | ORAL | 3 refills | Status: AC

## 2015-12-21 MED FILL — BISOPROLOL FUMARATE 5 MG TA: 5 | 30 days supply | Qty: 30 | Fill #0

## 2015-12-21 MED FILL — ?OMEPRAZOLE DR 20 MG CAPSUL: 20 | 30 days supply | Qty: 60 | Fill #0

## 2015-12-21 NOTE — Patient Instructions (Addendum)
Anthony Hammond was seen today for abdominal pain.  Diagnoses and all orders for this visit:  Hypothyroidism, unspecified type  History of cataract surgery, right -     Ambulatory referral to Ophthalmology  Pelvic pain in male -     DG HIP UNILAT WITH PELVIS 2-3 VIEWS LEFT; Future  Essential hypertension -     bisoprolol (ZEBETA) 5 MG tablet; Take 1 tablet (5 mg total) by mouth daily.  Gastroesophageal reflux disease, esophagitis presence not specified -     omeprazole (PRILOSEC) 20 MG capsule; Take 1 capsule (20 mg total) by mouth 2 (two) times daily before a meal.   Repeat TSH today You will be called with lab and x-ray results  F/u in 3 months for HTN and hypothyroidism   Dr. Armen PickupFunches

## 2015-12-21 NOTE — Assessment & Plan Note (Signed)
Compliant with synthroid Recheck TSH today Adjust synthroid as needed

## 2015-12-21 NOTE — Progress Notes (Signed)
Pt having abdominal pain left side, medication refills.  Pain level is a 6 when pt is walking.  Pt is getting flu shot.

## 2015-12-21 NOTE — Assessment & Plan Note (Addendum)
A: left pelvic pain in elderly male x 1 year exacerbated by walking. No definite hernia on exam. Suspect L hip arthritis.  P: Advised tylenol prn pain Left hip x-ray today Plan for ultrasound pelvis if x-ray non revealing.   X-ray revealed: degenerative changes in both hips and lumbar spine. This could be source of pain but since degenerative changes are not localized to the L where the pain is F/u imaging recommended Ultrasound ordered Please schedule

## 2015-12-21 NOTE — Progress Notes (Signed)
   Subjective:    Patient ID: Anthony Hammond, male    DOB: 08/12/1929, 82 y.o.   MRN: [id number]  CC: Left lower pelvic pain    HPI  1. Left lower  pelvic pain: pain off and on in LLQ with walking for over 10 mins  x one year. No pain at rest.  No bulging. Has hx of RLQ hernia s/p repair. No injury.  He believe he has a hernia on the L side now. He is not taking tylenol for pain.   2. Hypothyroidism: taking synthroid 75 mcg daily. No weight change, GI upset or mood changes.   3. L sided jaw pain: still occurring about 3 times per week. Chewing gum helps. No dental pain. No ear ache or fever. No chest pain.   4. Hx of R cataract surgery: request referral to ophthalmologist for vision check feels he needs to glasses since have his R cataract removed.   Soc Hx: non smoker  Review of Systems  Constitutional: Negative for chills, fatigue, fever and unexpected weight change.  HENT: Positive for dental problem. Negative for ear discharge and ear pain.   Eyes: Negative for visual disturbance.  Respiratory: Negative for cough and shortness of breath.   Cardiovascular: Negative for chest pain, palpitations and leg swelling.  Gastrointestinal: Positive for abdominal pain. Negative for abdominal distention, blood in stool, constipation, diarrhea, nausea and vomiting.  Endocrine: Negative for polydipsia, polyphagia and polyuria.  Musculoskeletal: Negative for arthralgias, back pain, gait problem, myalgias and neck pain.  Skin: Negative for rash.  Allergic/Immunologic: Negative for immunocompromised state.  Hematological: Negative for adenopathy. Does not bruise/bleed easily.  Psychiatric/Behavioral: Negative for dysphoric mood, sleep disturbance and suicidal ideas. The patient is not nervous/anxious.       Objective:   Physical Exam  Constitutional: He appears well-developed and well-nourished. No distress.  HENT:  Head: Normocephalic and atraumatic.  Neck: Normal range of motion. Neck  supple.  Cardiovascular: Normal rate, regular rhythm, normal heart sounds and intact distal pulses.   Pulmonary/Chest: Effort normal and breath sounds normal.  Abdominal: Soft. Bowel sounds are normal. He exhibits no distension and no mass. There is no tenderness. There is no rebound and no guarding.    Musculoskeletal: He exhibits no edema.       Left hip: Normal.  Neurological: He is alert.  Skin: Skin is warm and dry. No rash noted. No erythema.  Psychiatric: He has a normal mood and affect.   Lab Results  Component Value Date   TSH 6.284 (H) 10/06/2014         Assessment & Plan:

## 2015-12-22 ENCOUNTER — Telehealth: Payer: Self-pay

## 2015-12-22 LAB — TSH: TSH: 2.77 mIU/L (ref 0.40–4.50)

## 2015-12-22 MED ORDER — LEVOTHYROXINE SODIUM 75 MCG PO TABS: 75.0000 ug | ORAL_TABLET | Freq: Every day | ORAL | 5 refills | Status: AC

## 2015-12-22 MED ORDER — LEVOTHYROXINE SODIUM 75 MCG PO TABS: 75.0000 ug | ORAL_TABLET | Freq: Every day | ORAL | 1 refills | Status: DC

## 2015-12-22 MED FILL — LEVOTHYROXINE 75 MCG TABLET: 75 | 30 days supply | Qty: 30 | Fill #0

## 2015-12-22 NOTE — Addendum Note (Signed)
Addended by: Dessa PhiFUNCHES, Olukemi Panchal on: 12/22/2015 09:11 AM   Modules accepted: Orders

## 2015-12-22 NOTE — Telephone Encounter (Signed)
Pt was called on 10/06 and a VM was left informing pt to return call for lab results.

## 2015-12-22 NOTE — Addendum Note (Signed)
Addended by: Dessa PhiFUNCHES, Juliona Vales on: 12/22/2015 08:31 AM   Modules accepted: Orders

## 2015-12-27 NOTE — Telephone Encounter (Signed)
Pt was called on 10/11 and a VM was left informing pt of ultrasound appointment on 10/12.

## 2015-12-28 ENCOUNTER — Ambulatory Visit (HOSPITAL_COMMUNITY): Payer: Self-pay

## 2016-01-25 MED FILL — LEVOTHYROXINE 75 MCG TABLET: 75 | 30 days supply | Qty: 30 | Fill #1

## 2016-01-25 MED FILL — BISOPROLOL FUMARATE 5 MG TA: 5 | 30 days supply | Qty: 30 | Fill #1

## 2016-01-25 MED FILL — ?OMEPRAZOLE DR 20 MG CAPSUL: 20 | 30 days supply | Qty: 60 | Fill #1

## 2016-02-26 MED FILL — OMEPRAZOLE DR 20 MG CAPSULE: 20 | 30 days supply | Qty: 60 | Fill #2

## 2016-02-26 MED FILL — LEVOTHYROXINE 75 MCG TABLET: 75 | 30 days supply | Qty: 30 | Fill #2

## 2016-02-26 MED FILL — BISOPROLOL FUMARATE 5 MG TA: 5 | 30 days supply | Qty: 30 | Fill #2

## 2016-02-27 ENCOUNTER — Ambulatory Visit: Payer: Self-pay | Attending: Family Medicine | Admitting: Family Medicine

## 2016-02-27 ENCOUNTER — Encounter: Payer: Self-pay | Admitting: Family Medicine

## 2016-02-27 VITALS — BP 133/87 | HR 89 | Temp 98.4°F | Ht 66.5 in | Wt 165.0 lb

## 2016-02-27 DIAGNOSIS — Z23 Encounter for immunization: Secondary | ICD-10-CM

## 2016-02-27 DIAGNOSIS — L538 Other specified erythematous conditions: Secondary | ICD-10-CM

## 2016-02-27 DIAGNOSIS — L539 Erythematous condition, unspecified: Secondary | ICD-10-CM | POA: Insufficient documentation

## 2016-02-27 DIAGNOSIS — E039 Hypothyroidism, unspecified: Secondary | ICD-10-CM | POA: Insufficient documentation

## 2016-02-27 DIAGNOSIS — I10 Essential (primary) hypertension: Secondary | ICD-10-CM | POA: Insufficient documentation

## 2016-02-27 LAB — CBC
HEMATOCRIT: 48.9 % (ref 38.5–50.0)
HEMOGLOBIN: 16.2 g/dL (ref 13.2–17.1)
MCH: 29 pg (ref 27.0–33.0)
MCHC: 33.1 g/dL (ref 32.0–36.0)
MCV: 87.5 fL (ref 80.0–100.0)
MPV: 9.8 fL (ref 7.5–12.5)
Platelets: 263 10*3/uL (ref 140–400)
RBC: 5.59 MIL/uL (ref 4.20–5.80)
RDW: 15 % (ref 11.0–15.0)
WBC: 9.3 10*3/uL (ref 3.8–10.8)

## 2016-02-27 MED ORDER — KETOCONAZOLE 2 % EX CREA: 1.0000 "application " | TOPICAL_CREAM | Freq: Two times a day (BID) | CUTANEOUS | 0 refills | Status: AC

## 2016-02-27 MED ORDER — AMMONIUM LACTATE 12 % EX CREA: TOPICAL_CREAM | CUTANEOUS | 0 refills | Status: AC | PRN

## 2016-02-27 MED FILL — KETOCONAZOLE 2% CREAM: 2 | 15 days supply | Qty: 30 | Fill #0

## 2016-02-27 NOTE — Patient Instructions (Addendum)
Anthony Hammond was seen today for rash.  Diagnoses and all orders for this visit:  Macular erythematous rash -     CBC -     Basic Metabolic Panel -     ketoconazole (NIZORAL) 2 % cream; Apply 1 application topically 2 (two) times daily. Apply to skin rash for two weeks -     ammonium lactate (AMLACTIN) 12 % cream; Apply topically as needed for dry skin.  treat with nizoral antifungal cream Moisturize legs  If no improvement with refer to dermatology  for evaluation and possible biopsy  F/u in 4 weeks for rash  Dr. Armen PickupFunches

## 2016-02-27 NOTE — Progress Notes (Signed)
Pt is here today for rash on his legs. Pt has had rash for 2 weeks.

## 2016-02-27 NOTE — Progress Notes (Signed)
Subjective:  Patient ID: Anthony Hammond, male    DOB: 09/24/1929  Age: 82 y.o. MRN: [id number]  CC: Rash   HPI Anthony Hammond has HTN and hypothyroidism he presents for    1. Rash on legs: rash x 2 weeks. Erythematous rash. Non pruritic and painless. On legs only. Similar symptoms occurred two years ago and resolved spontaneously. No new medication. No fever or chills. No weight loss.   Social History  Substance Use Topics  . Smoking status: Never Smoker  . Smokeless tobacco: Not on file  . Alcohol use No    Outpatient Medications Prior to Visit  Medication Sig Dispense Refill  . acetaminophen (TYLENOL 8 HOUR) 650 MG CR tablet Take 1 tablet (650 mg total) by mouth every 8 (eight) hours as needed for pain. 30 tablet 3  . bisoprolol (ZEBETA) 5 MG tablet Take 1 tablet (5 mg total) by mouth daily. 90 tablet 3  . ibuprofen (ADVIL,MOTRIN) 600 MG tablet Take 1 tablet (600 mg total) by mouth every 8 (eight) hours as needed (jaw pain for next 5 days only wiht food). 20 tablet 0  . levothyroxine (SYNTHROID, LEVOTHROID) 75 MCG tablet Take 1 tablet (75 mcg total) by mouth daily. 30 tablet 5  . omeprazole (PRILOSEC) 20 MG capsule Take 1 capsule (20 mg total) by mouth 2 (two) times daily before a meal. 60 capsule 5   No facility-administered medications prior to visit.     ROS Review of Systems  Constitutional: Negative for chills, fatigue, fever and unexpected weight change.  HENT: Positive for dental problem. Negative for ear discharge and ear pain.   Eyes: Negative for visual disturbance.  Respiratory: Negative for cough and shortness of breath.   Cardiovascular: Negative for chest pain, palpitations and leg swelling.  Gastrointestinal: Positive for abdominal pain. Negative for abdominal distention, blood in stool, constipation, diarrhea, nausea and vomiting.  Endocrine: Negative for polydipsia, polyphagia and polyuria.  Musculoskeletal: Negative for arthralgias, back pain,  gait problem, myalgias and neck pain.  Skin: Positive for rash.  Allergic/Immunologic: Negative for immunocompromised state.  Hematological: Negative for adenopathy. Does not bruise/bleed easily.  Psychiatric/Behavioral: Negative for dysphoric mood, sleep disturbance and suicidal ideas. The patient is not nervous/anxious.     Objective:  BP 133/87 (BP Location: Right Arm, Patient Position: Sitting, Cuff Size: Small)   Pulse 89   Temp 98.4 F (36.9 C) (Oral)   Ht 5' 6.5" (1.689 m)   Wt 165 lb (74.8 kg)   SpO2 96%   BMI 26.23 kg/m   BP/Weight 02/27/2016 12/21/2015 09/05/2014  Systolic BP 133 127 135  Diastolic BP 87 83 81  Wt. (Lbs) 165 154 153  BMI 26.23 24.48 24.33    Physical Exam  Constitutional: He appears well-developed and well-nourished. No distress.  HENT:  Head: Normocephalic and atraumatic.  Neck: Normal range of motion. Neck supple.  Cardiovascular: Intact distal pulses.   Pulmonary/Chest: Effort normal.  Musculoskeletal: He exhibits no edema.  Neurological: He is alert.  Skin: Skin is warm and dry. Rash noted. No erythema.     Psychiatric: He has a normal mood and affect.   Assessment & Plan:  Anthony Hammond was seen today for rash.  Diagnoses and all orders for this visit:  Macular erythematous rash -     CBC -     Basic Metabolic Panel -     ketoconazole (NIZORAL) 2 % cream; Apply 1 application topically 2 (two) times daily. Apply to skin rash for two weeks -  ammonium lactate (AMLACTIN) 12 % cream; Apply topically as needed for dry skin.   There are no diagnoses linked to this encounter.  No orders of the defined types were placed in this encounter.   Follow-up: Return in about 4 weeks (around 03/26/2016) for rash .   Dessa PhiJosalyn Danijela Vessey MD

## 2016-02-28 LAB — BASIC METABOLIC PANEL
BUN: 17 mg/dL (ref 7–25)
CHLORIDE: 106 mmol/L (ref 98–110)
CO2: 28 mmol/L (ref 20–31)
Calcium: 9.1 mg/dL (ref 8.6–10.3)
Creat: 1 mg/dL (ref 0.70–1.11)
GLUCOSE: 129 mg/dL — AB (ref 65–99)
POTASSIUM: 4.6 mmol/L (ref 3.5–5.3)
SODIUM: 143 mmol/L (ref 135–146)

## 2016-03-06 ENCOUNTER — Telehealth: Payer: Self-pay

## 2016-03-06 NOTE — Telephone Encounter (Signed)
Pt was called and a VM was left informing pt of lab results. DPR on file. 

## 2016-03-29 MED FILL — LEVOTHYROXINE 75 MCG TABLET: 75 | 30 days supply | Qty: 30 | Fill #3

## 2016-03-29 MED FILL — BISOPROLOL FUMARATE 5 MG TA: 5 | 30 days supply | Qty: 30 | Fill #3

## 2016-04-19 MED FILL — ?OMEPRAZOLE DR 20 MG CAPSUL: 20 | 30 days supply | Qty: 60 | Fill #3

## 2016-04-26 MED FILL — LEVOTHYROXINE 75 MCG TABLET: 75 | 30 days supply | Qty: 30 | Fill #4

## 2016-04-26 MED FILL — BISOPROLOL FUMARATE 5 MG TA: 5 | 30 days supply | Qty: 30 | Fill #4

## 2016-07-01 MED FILL — ?OMEPRAZOLE DR 20 MG CAPSUL: 20 | 30 days supply | Qty: 60 | Fill #4

## 2016-07-01 MED FILL — LEVOTHYROXINE 75 MCG TABLET: 75 | 30 days supply | Qty: 30 | Fill #5

## 2016-07-01 MED FILL — BISOPROLOL FUMARATE 5 MG TA: 5 | 30 days supply | Qty: 30 | Fill #5

## 2016-07-31 ENCOUNTER — Encounter: Payer: Self-pay | Admitting: Family Medicine

## 2016-08-02 ENCOUNTER — Other Ambulatory Visit: Payer: Self-pay | Admitting: Family Medicine

## 2016-08-02 DIAGNOSIS — K219 Gastro-esophageal reflux disease without esophagitis: Secondary | ICD-10-CM

## 2016-08-02 MED FILL — OMEPRAZOLE DR 20 MG CAPSULE: 20 | 30 days supply | Qty: 60 | Fill #5

## 2016-08-02 MED FILL — BISOPROLOL FUMARATE 5 MG TA: 5 | 30 days supply | Qty: 30 | Fill #6

## 2016-08-02 MED FILL — LEVOTHYROXINE 75 MCG TABLET: 75 | 30 days supply | Qty: 30 | Fill #0

## 2016-09-03 ENCOUNTER — Other Ambulatory Visit: Payer: Self-pay | Admitting: Family Medicine

## 2016-09-03 DIAGNOSIS — K219 Gastro-esophageal reflux disease without esophagitis: Secondary | ICD-10-CM

## 2016-09-03 MED FILL — BISOPROLOL FUMARATE 5 MG TA: 5 | 30 days supply | Qty: 30 | Fill #7

## 2016-09-04 MED FILL — LEVOTHYROXINE 75 MCG TABLET: 75 | 30 days supply | Qty: 30 | Fill #0

## 2016-09-04 MED FILL — OMEPRAZOLE DR 20 MG CAPSULE: 20 | 30 days supply | Qty: 60 | Fill #0

## 2016-09-19 ENCOUNTER — Ambulatory Visit: Payer: Self-pay | Admitting: Family Medicine

## 2020-05-16 ENCOUNTER — Encounter (HOSPITAL_COMMUNITY): Payer: Self-pay | Admitting: Emergency Medicine

## 2020-05-16 ENCOUNTER — Other Ambulatory Visit: Payer: Self-pay

## 2020-05-16 ENCOUNTER — Ambulatory Visit (HOSPITAL_COMMUNITY)
Admission: EM | Admit: 2020-05-16 | Discharge: 2020-05-16 | Disposition: A | Discharge status: Home / Self Care | Payer: Self-pay | Attending: Emergency Medicine | Admitting: Emergency Medicine

## 2020-05-16 ENCOUNTER — Ambulatory Visit (INDEPENDENT_AMBULATORY_CARE_PROVIDER_SITE_OTHER): Payer: Self-pay

## 2020-05-16 DIAGNOSIS — M26629 Arthralgia of temporomandibular joint, unspecified side: Secondary | ICD-10-CM

## 2020-05-16 DIAGNOSIS — M25462 Effusion, left knee: Secondary | ICD-10-CM

## 2020-05-16 MED ORDER — IBUPROFEN 600 MG PO TABS: 600.0000 mg | ORAL_TABLET | Freq: Three times a day (TID) | ORAL | 1 refills | Status: DC | PRN

## 2020-05-16 MED ORDER — IBUPROFEN 600 MG PO TABS: 600.0000 mg | ORAL_TABLET | Freq: Three times a day (TID) | ORAL | 1 refills | Status: AC | PRN

## 2020-05-16 NOTE — Discharge Instructions (Addendum)
Can take 600 mg ibuprofen every 6 hours as needed with food to manage pain and swelling  Wear knee brace while awake, can remove for bed  Follow up with orthopedics if no improvement or symptoms persistent

## 2020-05-16 NOTE — ED Provider Notes (Signed)
[facility]    CSN: [id number] Arrival date & time: 05/16/20  1032      History   Chief Complaint Chief Complaint  Patient presents with  . Knee Pain    HPI Aydon Witt is a 87 y.o. male.   Patient presents with left knee pain 8/10 starting three weeks ago after hearing pop while going up some stairs. Mild swelling present. Pain elicited on ROM. Denies erthyema, numbness tingling or radiating pain. Has history of knee injury requiring injections for treatment 3 years ago.   Past Medical History:  Diagnosis Date  . Hypertension   . Thyroid disease     Patient Active Problem List   Diagnosis Date Noted  . History of cataract surgery, right 12/21/2015  . Pelvic pain in male 12/21/2015  . TMJ syndrome 09/05/2014  . Healthcare maintenance 05/12/2014  . Shoulder pain, right 05/12/2014  . Plantar fasciitis 12/15/2012  . Essential hypertension 12/15/2012  . Hypothyroidism 12/15/2012    Past Surgical History:  Procedure Laterality Date  . CHOLECYSTECTOMY    . HERNIA REPAIR         Home Medications    Prior to Admission medications   Medication Sig Start Date End Date Taking? Authorizing Provider  bisoprolol (ZEBETA) 5 MG tablet Take 1 tablet (5 mg total) by mouth daily. 12/21/15  Yes Funches, Josalyn, MD  levothyroxine (SYNTHROID, LEVOTHROID) 75 MCG tablet Take 1 tablet (75 mcg total) by mouth daily. 12/22/15  Yes Funches, Josalyn, MD  omeprazole (PRILOSEC) 20 MG capsule TAKE 1 CAPSULE BY MOUTH 2 TIMES DAILY BEFORE A MEAL. 08/02/16  Yes Funches, Josalyn, MD  acetaminophen (TYLENOL 8 HOUR) 650 MG CR tablet Take 1 tablet (650 mg total) by mouth every 8 (eight) hours as needed for pain. 09/05/14   Funches, Gerilyn Nestle, MD  ammonium lactate (AMLACTIN) 12 % cream Apply topically as needed for dry skin. 02/27/16   Funches, Gerilyn Nestle, MD  ibuprofen (ADVIL,MOTRIN) 600 MG tablet Take 1 tablet (600 mg total) by mouth every 8 (eight) hours as needed (jaw pain for next 5  days only wiht food). 09/05/14   Funches, Gerilyn Nestle, MD  ketoconazole (NIZORAL) 2 % cream Apply 1 application topically 2 (two) times daily. Apply to skin rash for two weeks 02/27/16   Dessa Phi, MD  levothyroxine (SYNTHROID, LEVOTHROID) 75 MCG tablet TAKE 1 TABLET BY MOUTH DAILY 09/03/16   Funches, Gerilyn Nestle, MD  omeprazole (PRILOSEC) 20 MG capsule TAKE 1 CAPSULE BY MOUTH 2 TIMES DAILY BEFORE A MEAL. 09/03/16   Dessa Phi, MD    Family History Family History  Problem Relation Age of Onset  . Celiac disease Son   . Diabetes Brother     Social History Social History   Tobacco Use  . Smoking status: Never Smoker  Substance Use Topics  . Alcohol use: No  . Drug use: No     Allergies   Patient has no known allergies.   Review of Systems Review of Systems  Constitutional: Negative.   HENT: Negative.   Respiratory: Negative.   Cardiovascular: Negative.   Musculoskeletal: Negative.   Skin: Negative.   Neurological: Negative.   Psychiatric/Behavioral: Negative.      Physical Exam Triage Vital Signs ED Triage Vitals  Enc Vitals Group     BP 05/16/20 1154 (!) 174/68     Pulse Rate 05/16/20 1154 61     Resp 05/16/20 1154 (!) 22     Temp 05/16/20 1154 (!) 97.4 F (36.3 C)  Temp Source 05/16/20 1154 Oral     SpO2 05/16/20 1154 99 %     Weight --      Height --      Head Circumference --      Peak Flow --      Pain Score 05/16/20 1155 8     Pain Loc --      Pain Edu? --      Excl. in GC? --    No data found.  Updated Vital Signs BP (!) 174/68 (BP Location: Right Arm)   Pulse 61   Temp (!) 97.4 F (36.3 C) (Oral)   Resp (!) 22   SpO2 99%   Visual Acuity Right Eye Distance:   Left Eye Distance:   Bilateral Distance:    Right Eye Near:   Left Eye Near:    Bilateral Near:     Physical Exam Constitutional:      Appearance: Normal appearance. He is normal weight.  HENT:     Head: Normocephalic.  Pulmonary:     Effort: Pulmonary effort is  normal.  Musculoskeletal:     Right knee: Normal.     Left knee: No swelling, deformity, erythema or ecchymosis.       Legs:     Comments: Tenderness and effusion over the medial  meniscus, ROM intact,   Skin:    General: Skin is warm and dry.  Neurological:     General: No focal deficit present.     Mental Status: He is alert and oriented to person, place, and time. Mental status is at baseline.  Psychiatric:        Mood and Affect: Mood normal.        Behavior: Behavior normal.        Thought Content: Thought content normal.        Judgment: Judgment normal.      UC Treatments / Results  Labs (all labs ordered are listed, but only abnormal results are displayed) Labs Reviewed - No data to display  EKG   Radiology No results found.  Procedures Procedures (including critical care time)  Medications Ordered in UC Medications - No data to display  Initial Impression / Assessment and Plan / UC Course  I have reviewed the triage vital signs and the nursing notes.  Pertinent labs & imaging results that were available during my care of the patient were reviewed by me and considered in my medical decision making (see chart for details).   Effusion of left knee joint  1. Ibuprofen 600 mg every six hours as needed 2. Knee brace throughout day 3. Follow up with orthopedics if no improvement or worsening, local practice information given Final Clinical Impressions(s) / UC Diagnoses   Final diagnoses:  None   Discharge Instructions   None    ED Prescriptions    None     PDMP not reviewed this encounter.   Valinda Hoar, NP 05/16/20 1353

## 2020-05-16 NOTE — ED Triage Notes (Signed)
Left knee pain for 3 weeks after stepping awkwardly and heard a pop.  Medial knee is location of pain

## 2020-05-16 NOTE — ED Notes (Signed)
translator [phone] unable to understand patient.  Adult daughter with patient, dialect is algrian?  translator reports no one available to translate and says daughter would be the best option.

## 2020-05-27 ENCOUNTER — Other Ambulatory Visit: Payer: Self-pay

## 2020-05-27 ENCOUNTER — Encounter (HOSPITAL_BASED_OUTPATIENT_CLINIC_OR_DEPARTMENT_OTHER): Payer: Self-pay | Admitting: *Deleted

## 2020-05-27 ENCOUNTER — Emergency Department (HOSPITAL_BASED_OUTPATIENT_CLINIC_OR_DEPARTMENT_OTHER)
Admission: EM | Admit: 2020-05-27 | Discharge: 2020-05-27 | Disposition: A | Discharge status: Home / Self Care | Payer: Self-pay | Attending: Emergency Medicine | Admitting: Emergency Medicine

## 2020-05-27 DIAGNOSIS — M25562 Pain in left knee: Secondary | ICD-10-CM | POA: Insufficient documentation

## 2020-05-27 DIAGNOSIS — E039 Hypothyroidism, unspecified: Secondary | ICD-10-CM | POA: Insufficient documentation

## 2020-05-27 DIAGNOSIS — I10 Essential (primary) hypertension: Secondary | ICD-10-CM | POA: Insufficient documentation

## 2020-05-27 DIAGNOSIS — Z79899 Other long term (current) drug therapy: Secondary | ICD-10-CM | POA: Insufficient documentation

## 2020-05-27 MED ORDER — TRAMADOL HCL 50 MG PO TABS: 50.0000 mg | ORAL_TABLET | Freq: Four times a day (QID) | ORAL | 0 refills | Status: AC | PRN

## 2020-05-27 NOTE — ED Provider Notes (Addendum)
[facility] EMERGENCY DEPARTMENT Provider Note   CSN: [id number] Arrival date & time: 05/27/20  1436     History Chief Complaint  Patient presents with  . Knee Pain    Anthony Hammond is a 87 y.o. male.  The history is provided by the patient and medical records. No language interpreter was used.  Knee Pain Location:  Knee Time since incident:  1 month Injury: yes   Knee location:  L knee Pain details:    Quality:  Aching   Radiates to:  Does not radiate   Severity:  Moderate   Onset quality:  Sudden   Duration:  1 month   Timing:  Intermittent   Progression:  Waxing and waning Chronicity:  New Tetanus status:  Unknown Prior injury to area:  Unable to specify Relieved by:  Rest and immobilization Worsened by:  Bearing weight Ineffective treatments:  None tried Associated symptoms: swelling   Associated symptoms: no back pain, no decreased ROM, no fatigue, no fever, no neck pain, no numbness and no tingling        Past Medical History:  Diagnosis Date  . Hypertension   . Thyroid disease     Patient Active Problem List   Diagnosis Date Noted  . History of cataract surgery, right 12/21/2015  . Pelvic pain in male 12/21/2015  . TMJ syndrome 09/05/2014  . Healthcare maintenance 05/12/2014  . Shoulder pain, right 05/12/2014  . Plantar fasciitis 12/15/2012  . Essential hypertension 12/15/2012  . Hypothyroidism 12/15/2012    Past Surgical History:  Procedure Laterality Date  . CHOLECYSTECTOMY    . HERNIA REPAIR         Family History  Problem Relation Age of Onset  . Celiac disease Son   . Diabetes Brother     Social History   Tobacco Use  . Smoking status: Never Smoker  . Smokeless tobacco: Never Used  Substance Use Topics  . Alcohol use: No  . Drug use: No    Home Medications Prior to Admission medications   Medication Sig Start Date End Date Taking? Authorizing Provider  acetaminophen (TYLENOL 8 HOUR) 650 MG CR tablet  Take 1 tablet (650 mg total) by mouth every 8 (eight) hours as needed for pain. 09/05/14   Funches, Gerilyn Nestle, MD  ammonium lactate (AMLACTIN) 12 % cream Apply topically as needed for dry skin. 02/27/16   Funches, Gerilyn Nestle, MD  bisoprolol (ZEBETA) 5 MG tablet Take 1 tablet (5 mg total) by mouth daily. 12/21/15   Funches, Gerilyn Nestle, MD  ibuprofen (ADVIL) 600 MG tablet Take 1 tablet (600 mg total) by mouth every 8 (eight) hours as needed for mild pain or moderate pain. 05/16/20   White, Elita Boone, NP  ketoconazole (NIZORAL) 2 % cream Apply 1 application topically 2 (two) times daily. Apply to skin rash for two weeks 02/27/16   Dessa Phi, MD  levothyroxine (SYNTHROID, LEVOTHROID) 75 MCG tablet Take 1 tablet (75 mcg total) by mouth daily. 12/22/15   Funches, Gerilyn Nestle, MD  levothyroxine (SYNTHROID, LEVOTHROID) 75 MCG tablet TAKE 1 TABLET BY MOUTH DAILY 09/03/16   Funches, Gerilyn Nestle, MD  omeprazole (PRILOSEC) 20 MG capsule TAKE 1 CAPSULE BY MOUTH 2 TIMES DAILY BEFORE A MEAL. 08/02/16   Funches, Gerilyn Nestle, MD  omeprazole (PRILOSEC) 20 MG capsule TAKE 1 CAPSULE BY MOUTH 2 TIMES DAILY BEFORE A MEAL. 09/03/16   Dessa Phi, MD    Allergies    Patient has no known allergies.  Review of Systems   Review  of Systems  Constitutional: Negative for chills, diaphoresis, fatigue and fever.  HENT: Negative for congestion.   Respiratory: Negative for cough, chest tightness, shortness of breath and wheezing.   Cardiovascular: Negative for chest pain and palpitations.  Gastrointestinal: Negative for abdominal pain, constipation, diarrhea, nausea and vomiting.  Genitourinary: Negative for flank pain and frequency.  Musculoskeletal: Negative for back pain, neck pain and neck stiffness.  Skin: Negative for rash and wound.  Neurological: Negative for light-headedness and headaches.  Psychiatric/Behavioral: Negative for agitation and confusion.  All other systems reviewed and are negative.   Physical Exam Updated Vital  Signs BP (!) 148/81 (BP Location: Right Arm)   Pulse 69   Temp 98 F (36.7 C) (Oral)   Resp 16   Ht  (1.676 m)   Wt 70 kg   SpO2 98%   BMI 24.91 kg/m   Physical Exam Vitals and nursing note reviewed.  Constitutional:      General: He is not in acute distress.    Appearance: He is well-developed. He is not ill-appearing, toxic-appearing or diaphoretic.  HENT:     Head: Normocephalic and atraumatic.     Nose: No congestion or rhinorrhea.  Eyes:     Conjunctiva/sclera: Conjunctivae normal.  Cardiovascular:     Rate and Rhythm: Normal rate and regular rhythm.     Heart sounds: No murmur heard.   Pulmonary:     Effort: Pulmonary effort is normal. No respiratory distress.     Breath sounds: Normal breath sounds. No wheezing, rhonchi or rales.  Chest:     Chest wall: No tenderness.  Abdominal:     Palpations: Abdomen is soft.     Tenderness: There is no abdominal tenderness.  Musculoskeletal:        General: Swelling and tenderness present.     Cervical back: Neck supple.     Left knee: Swelling present. No deformity, erythema, lacerations, bony tenderness or crepitus. Normal range of motion. Tenderness present.     Right lower leg: No edema.     Left lower leg: No lacerations or tenderness. No edema.       Legs:     Comments: Normal sensation and strength in lower extremity.  Normal pulses.  No significant tenderness in the calf or shin or thigh.  Tenderness in the medial left knee.  No ecchymosis or erythema.  No crepitance.  Skin:    General: Skin is warm and dry.     Capillary Refill: Capillary refill takes less than 2 seconds.     Findings: No erythema or rash.  Neurological:     General: No focal deficit present.     Mental Status: He is alert.  Psychiatric:        Mood and Affect: Mood normal.     ED Results / Procedures / Treatments   Labs (all labs ordered are listed, but only abnormal results are displayed) Labs Reviewed - No data to  display  EKG None  Radiology No results found.  Procedures Procedures     Medications Ordered in ED Medications - No data to display  ED Course  I have reviewed the triage vital signs and the nursing notes.  Pertinent labs & imaging results that were available during my care of the patient were reviewed by me and considered in my medical decision making (see chart for details).    MDM Rules/Calculators/A&P  Nathanie Tolen is a 87 y.o. male with a past medical history significant for thyroid disease, plantar fasciitis, hypertension, and prior surgical hernia repair and cholecystectomy who presents with left knee pain.  Patient reports that 1 month ago, he was walking up some stairs and heard a pop in the left knee.  He had sudden onset of pain and some swelling in the medial inferior aspect of the knee.  He reports that he has been using over-the-counter medications with Motrin and Tylenol, compression, rest, and elevation to treat but they continue to hurt.  He was seen approximately 2 weeks ago and had an x-ray showing a mild effusion but otherwise no significant bony abnormality.  Patient was referred to orthopedics but has not been able to be seen due to insurance difficulties.  He and the family thought that they were told he may need a CT imaging to further evaluate and so they present today for reevaluation.  On exam, patient does have some small area of swelling and tenderness in the left medial joint line of the knee.  There was not significant tenderness with the patella or lateral side.  He did not have tenderness or swelling in the calf or thigh.  Normal sensation and strength in legs.  Normal appearance with no significant erythema, crepitance, or warmth.  No tenderness in the hips or abdomen or back.  Lungs clear and chest nontender.  Had a long shared decision made conversation with patient and family who work in the [employer] system.  I do suspect  he has a meniscus, ligamentous, tendinous, or soft tissue injury causing the ongoing pain.  I do feel that more advanced imaging will be needed to further determine the extent of his injuries however MRI will be likely the best option as opposed to a CT.  Clinically have a low suspicion for a tibial plateau fracture or other bony fracture as his initial x-ray was several weeks after the initial injury.  Doubt occult injury from a bony standpoint.  We also discussed that I did not feel that aspiration would be as beneficial as it was not a diffuse swelling and more of a local swelling on the medial side.  With lack of history of gout, no significant concern for infection at this time, we agreed not to pursue knee aspiration.   We did agree to escalate his symptom management until he can see a sports medicine orthopedist to help arrange outpatient imaging like MRI to determine a plan.  We will give tramadol as other medications have not been improving symptoms and will give him a more sturdy knee immobilizer opposed to a knee sleeve.  Patient and family agree with plan of care as well as return precautions for any new or worsened symptoms such as those of DVT at which we discussed extensively.  Patient and family no other questions or concerns and agree with discharge.   Final Clinical Impression(s) / ED Diagnoses Final diagnoses:  Acute pain of left knee  Medial joint line tenderness of left knee    Rx / DC Orders ED Discharge Orders         Ordered    traMADol (ULTRAM) 50 MG tablet  Every 6 hours PRN        05/27/20 1530          Clinical Impression: 1. Acute pain of left knee   2. Medial joint line tenderness of left knee     Disposition: Discharge  Condition: Good  I have discussed the results, Dx and Tx plan with the pt(& family if present). He/she/they expressed understanding and agree(s) with the plan. Discharge instructions discussed at great length. Strict return precautions  discussed and pt &/or family have verbalized understanding of the instructions. No further questions at time of discharge.    New Prescriptions   TRAMADOL (ULTRAM) 50 MG TABLET    Take 1 tablet (50 mg total) by mouth every 6 (six) hours as needed.    Follow Up: Myra Rude, MD [address] [place] [address] [phone]     Domingo Mend [address] [place] [address] [phone]     Specialists, [facility] [address] [place] [address] [phone]     [facility]. [address] [place] [address] [phone]       Samora Jernberg, Canary Brim, MD 05/27/20 1543    Morrie Daywalt, Canary Brim, MD 05/27/20 1547

## 2020-05-27 NOTE — Discharge Instructions (Signed)
Your history and exam today are consistent with a likely soft tissue, ligamentous, or tendinous injury to the left knee on the medial side.  As you previously had x-ray imaging that did not show any bony abnormality or fracture, we had a discussion today and did not feel that further x-ray would be beneficial.  Your history does not sound convincing for infection given the lack of significant swelling, redness, fevers, chills, or other infectious appearance.  You did not have a history of gout or other crystalline arthropathy and the x-ray did not show significant arthritis.  I suspect soft tissue injury is the primary cause of your discomfort.  Please use a stronger knee immobilizer and use the new pain medicine to help when the pain is severe.  Please continue using over-the-counter anti-inflammatory medications and continue to rest and elevate.  Please follow-up with either sports medicine or orthopedics team as I suspect MRI will be the most beneficial outpatient imaging to determine your injury and will help guide them in determining further management.  If any symptoms change acutely or he develop signs and symptoms of DVT as we discussed, please return to the nearest emergency department.

## 2020-05-27 NOTE — ED Triage Notes (Signed)
Pt felt a pop in left knee while walking downstairs approx 1 month ago. He was seen at Urgent Care on 3/1 and had xray. States pain is worse. Pt taking tylenol for pain. Ambulatory to triage with cane

## 2023-02-18 IMAGING — DX DG KNEE COMPLETE 4+V*L*
4 series · 4 of 4 positions shown · non-contrast
Comparison: None.

CLINICAL DATA: Swelling

EXAM:
LEFT KNEE - COMPLETE 4+ VIEW

[knee ap]
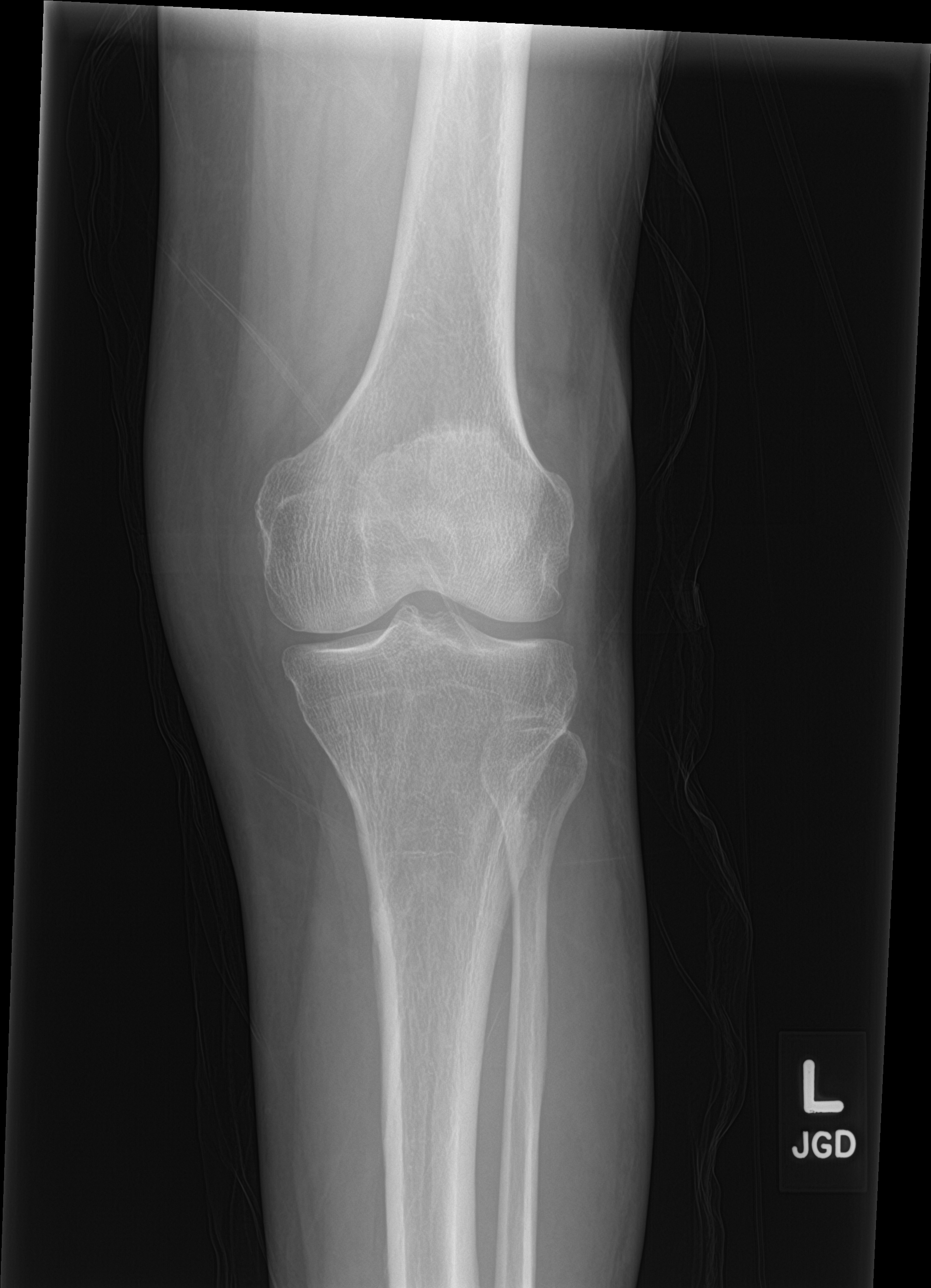

[knee obl]
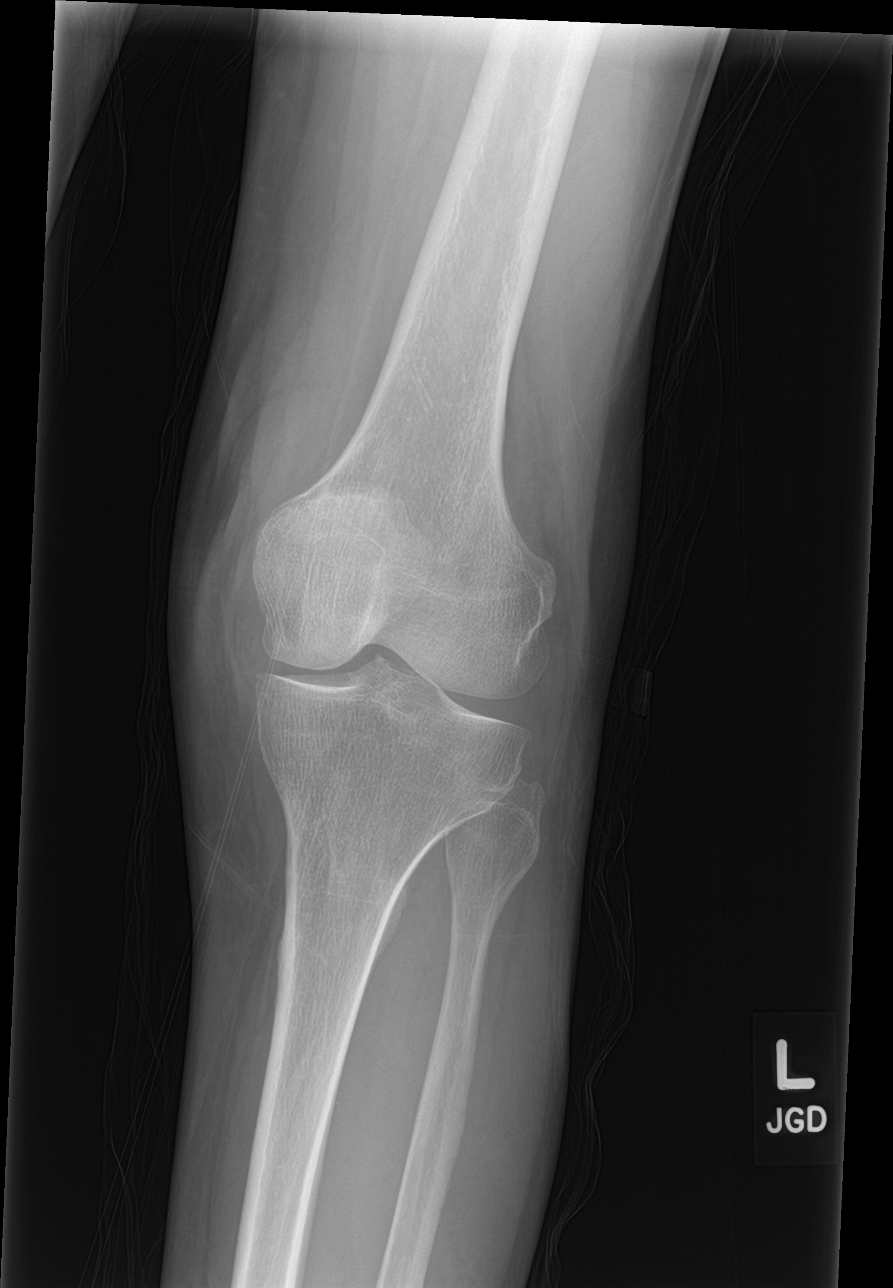

[knee lat]
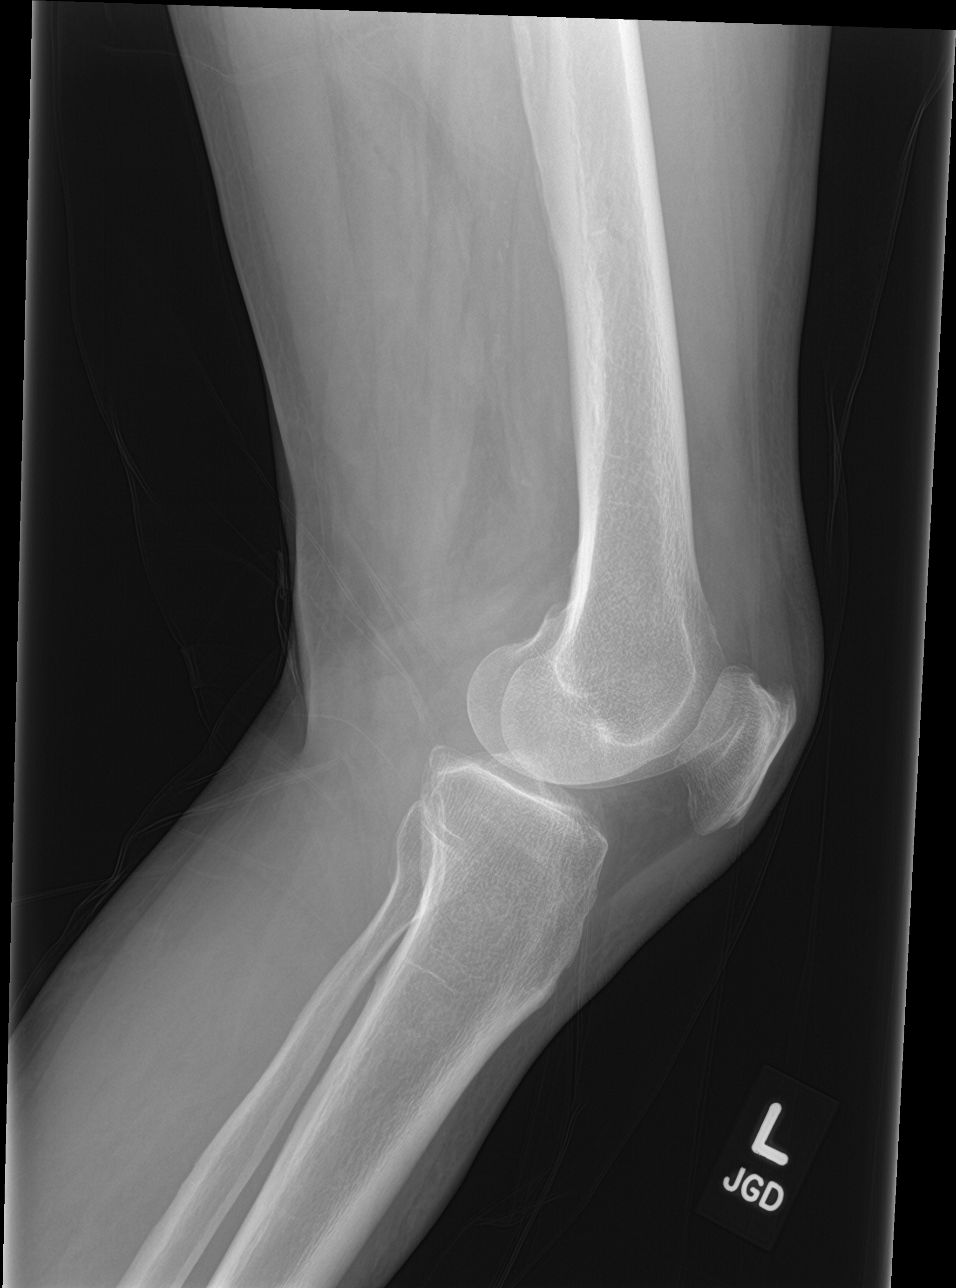

[knee sunrise]
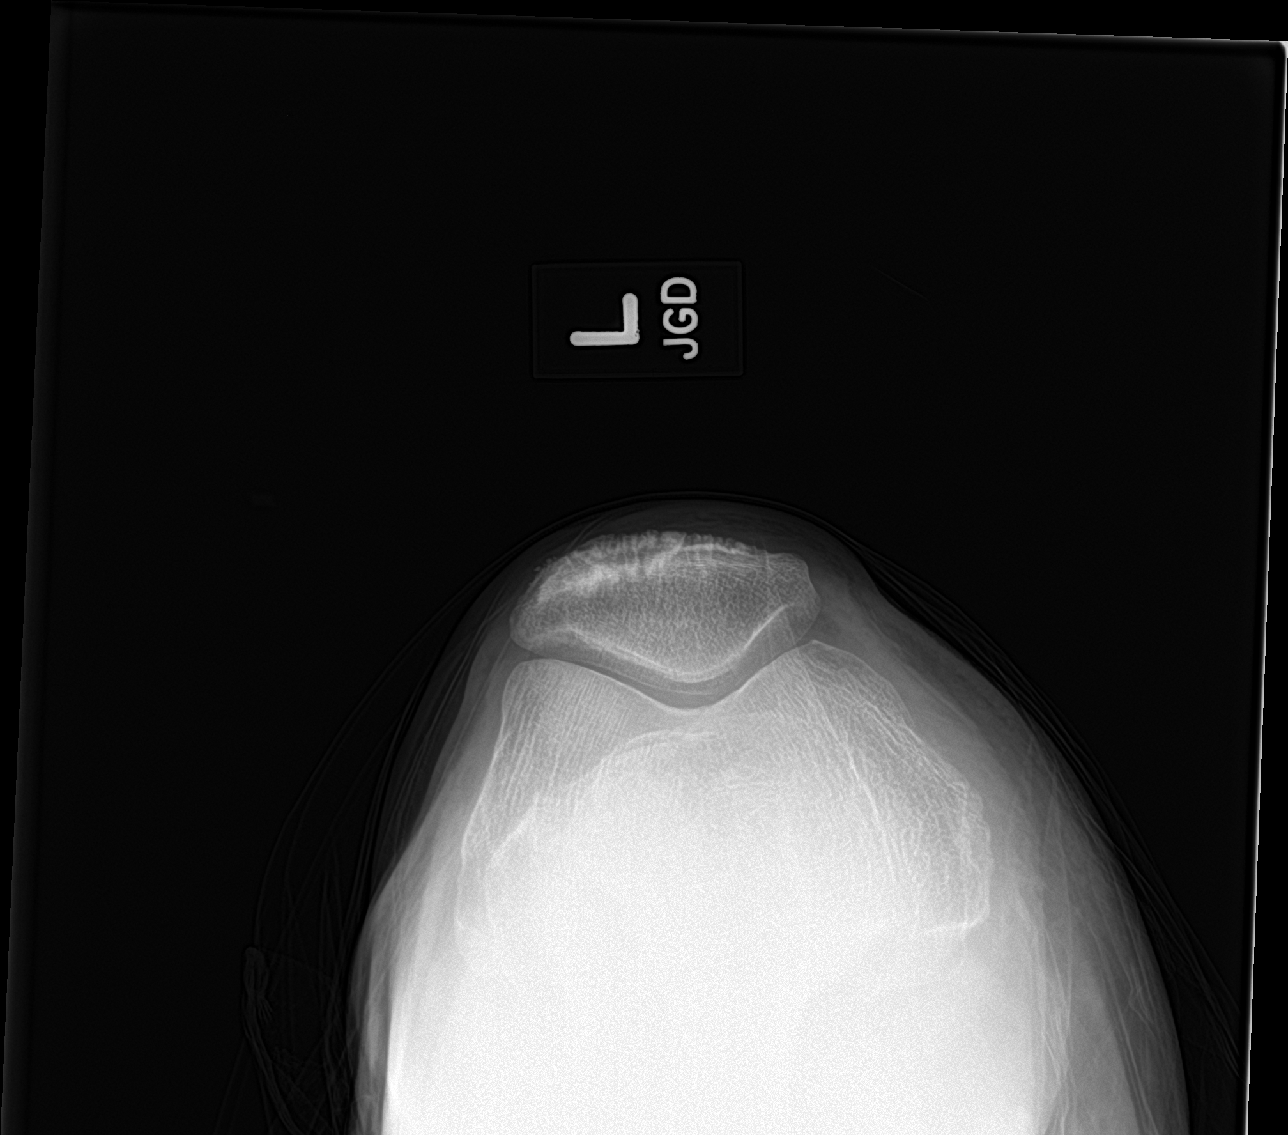

[4 of 4 positions shown; findings below may reference images not displayed]

FINDINGS: Alignment is anatomic. There is no acute fracture. Joint effusion is
present. No significant degenerative changes.
IMPRESSION: Joint effusion. No acute osseous abnormality.

## 2023-02-28 ENCOUNTER — Ambulatory Visit
Admission: RE | Admit: 2023-02-28 | Discharge: 2023-02-28 | Disposition: A | Discharge status: Home / Self Care | Payer: Medicaid Other | Source: Ambulatory Visit | Attending: Registered Nurse | Admitting: Registered Nurse

## 2023-02-28 ENCOUNTER — Other Ambulatory Visit: Payer: Self-pay | Admitting: Registered Nurse

## 2023-02-28 DIAGNOSIS — M79605 Pain in left leg: Secondary | ICD-10-CM

## 2023-02-28 DIAGNOSIS — M79604 Pain in right leg: Secondary | ICD-10-CM

## 2023-02-28 DIAGNOSIS — J188 Other pneumonia, unspecified organism: Secondary | ICD-10-CM

## 2023-04-03 ENCOUNTER — Encounter (HOSPITAL_BASED_OUTPATIENT_CLINIC_OR_DEPARTMENT_OTHER): Payer: Self-pay

## 2023-04-03 ENCOUNTER — Emergency Department (HOSPITAL_BASED_OUTPATIENT_CLINIC_OR_DEPARTMENT_OTHER): Payer: Medicaid Other

## 2023-04-03 ENCOUNTER — Other Ambulatory Visit: Payer: Self-pay

## 2023-04-03 ENCOUNTER — Emergency Department (HOSPITAL_BASED_OUTPATIENT_CLINIC_OR_DEPARTMENT_OTHER)
Admission: EM | Admit: 2023-04-03 | Discharge: 2023-04-03 | Disposition: A | Discharge status: Home / Self Care | Payer: Medicaid Other

## 2023-04-03 DIAGNOSIS — Z79899 Other long term (current) drug therapy: Secondary | ICD-10-CM | POA: Insufficient documentation

## 2023-04-03 DIAGNOSIS — W19XXXA Unspecified fall, initial encounter: Secondary | ICD-10-CM | POA: Diagnosis not present

## 2023-04-03 DIAGNOSIS — M25551 Pain in right hip: Secondary | ICD-10-CM | POA: Diagnosis present

## 2023-04-03 DIAGNOSIS — E039 Hypothyroidism, unspecified: Secondary | ICD-10-CM | POA: Diagnosis not present

## 2023-04-03 MED ORDER — DICLOFENAC SODIUM 1 % EX GEL
2.0000 g | Freq: Once | CUTANEOUS | Status: AC
  Administered 2023-04-03: 2 g via TOPICAL
  Filled 2023-04-03: qty 100

## 2023-04-03 MED ORDER — METHYLPREDNISOLONE 4 MG PO TBPK: ORAL_TABLET | ORAL | 0 refills | Status: AC

## 2023-04-03 NOTE — ED Triage Notes (Signed)
Intermittent right leg and hip pain x 3 weeks , fell 1 week ago , and started having severe right leg pain 3 days after the fall . Now he is unable to bear any weight  on the right leg . Unsteady gait .  PCP sent him for xray .

## 2023-04-03 NOTE — Discharge Instructions (Signed)
Your workup here is reassuring.  Try the Voltaren gel at home as well as the Medrol Dosepak.  Please call and schedule follow-up appointment with the orthopedist at the number provided.  Return to the ER for fever or worsening symptoms.

## 2023-04-03 NOTE — ED Provider Notes (Signed)
[facility] Provider Note   CSN: [id number] Arrival date & time: 04/03/23  0746     History  Chief Complaint  Patient presents with   Leg Pain    right    Anthony Hammond is a 89 y.o. male.  89 year old male with past medical history of hypothyroidism and hyperlipidemia presenting to the emergency department today with right hip pain.  The patient has had issues with sciatica and arthritis in his right hip for quite some time.  He fell 1 week ago and has been having worsening pain since then.  The patient had an e-visit yesterday with his primary care provider and x-rays were recommended.  The symptoms were getting a little worse so he was brought to the emergency department for further evaluation.  The patient denies any other injuries.  This was a mechanical fall.  The patient's daughter-in-law who is one of our nurses here in the emergency department acts as his interpreter   Leg Pain      Home Medications Prior to Admission medications   Medication Sig Start Date End Date Taking? Authorizing Provider  methylPREDNISolone (MEDROL DOSEPAK) 4 MG TBPK tablet Take as directed 04/03/23  Yes Durwin Glaze, MD  acetaminophen (TYLENOL 8 HOUR) 650 MG CR tablet Take 1 tablet (650 mg total) by mouth every 8 (eight) hours as needed for pain. 09/05/14   Funches, Gerilyn Nestle, MD  ammonium lactate (AMLACTIN) 12 % cream Apply topically as needed for dry skin. 02/27/16   Funches, Gerilyn Nestle, MD  bisoprolol (ZEBETA) 5 MG tablet Take 1 tablet (5 mg total) by mouth daily. 12/21/15   Funches, Gerilyn Nestle, MD  ibuprofen (ADVIL) 600 MG tablet Take 1 tablet (600 mg total) by mouth every 8 (eight) hours as needed for mild pain or moderate pain. 05/16/20   White, Elita Boone, NP  ketoconazole (NIZORAL) 2 % cream Apply 1 application topically 2 (two) times daily. Apply to skin rash for two weeks 02/27/16   Dessa Phi, MD  levothyroxine (SYNTHROID, LEVOTHROID) 75 MCG  tablet Take 1 tablet (75 mcg total) by mouth daily. 12/22/15   Funches, Gerilyn Nestle, MD  levothyroxine (SYNTHROID, LEVOTHROID) 75 MCG tablet TAKE 1 TABLET BY MOUTH DAILY 09/03/16   Funches, Gerilyn Nestle, MD  omeprazole (PRILOSEC) 20 MG capsule TAKE 1 CAPSULE BY MOUTH 2 TIMES DAILY BEFORE A MEAL. 08/02/16   Funches, Gerilyn Nestle, MD  omeprazole (PRILOSEC) 20 MG capsule TAKE 1 CAPSULE BY MOUTH 2 TIMES DAILY BEFORE A MEAL. 09/03/16   Funches, Gerilyn Nestle, MD  traMADol (ULTRAM) 50 MG tablet Take 1 tablet (50 mg total) by mouth every 6 (six) hours as needed. 05/27/20   Tegeler, Canary Brim, MD      Allergies    Patient has no known allergies.    Review of Systems   Review of Systems  Musculoskeletal:        Right hip pain  All other systems reviewed and are negative.   Physical Exam Updated Vital Signs BP (!) 145/66   Pulse 65   Temp 97.6 F (36.4 C) (Oral)   Resp 18   Wt 70 kg   SpO2 99%   BMI 24.91 kg/m  Physical Exam Vitals and nursing note reviewed.   Gen: NAD Eyes: PERRL HEENT: no oropharyngeal swelling Neck: trachea midline Resp: clear to auscultation bilaterally Card: RRR, no murmurs, rubs, or gallops Abd: nontender, nondistended MSK: Pelvis stable, tender over the proximal femur on the right as well as the mid femur  with no obvious deformity noted, tender over the right inferior pubic ramus Extremities: no calf tenderness, no edema Vascular: 2+ radial pulses bilaterally, 2+ DP pulses bilaterally Skin: no rashes Psyc: acting appropriately   ED Results / Procedures / Treatments   Labs (all labs ordered are listed, but only abnormal results are displayed) Labs Reviewed - No data to display  EKG None  Radiology CT PELVIS WO CONTRAST Result Date: 04/03/2023 CLINICAL DATA:  Right leg and hip pain.  Fall 1 week ago. EXAM: CT PELVIS WITHOUT CONTRAST TECHNIQUE: Multidetector CT imaging of the pelvis was performed following the standard protocol without intravenous contrast. RADIATION DOSE  REDUCTION: This exam was performed according to the departmental dose-optimization program which includes automated exposure control, adjustment of the mA and/or kV according to patient size and/or use of iterative reconstruction technique. COMPARISON:  Plain films today. FINDINGS: Urinary Tract:  No abnormality visualized. Bowel:  Unremarkable visualized pelvic bowel loops. Vascular/Lymphatic: Aortoiliac atherosclerosis. No aneurysm or adenopathy. Reproductive:  No mass or other significant abnormality Other:  No free fluid or free air. Musculoskeletal: No suspicious bone lesions identified. No fracture, subluxation or dislocation. IMPRESSION: No acute bony abnormality.  No visible hip fracture. Aortoiliac atherosclerosis. Electronically Signed   By: Charlett Nose M.D.   On: 04/03/2023 11:31   DG Pelvis 1-2 Views Result Date: 04/03/2023 CLINICAL DATA:  284132 Pain 144615. Fall 5 days back. Right leg pain. EXAM: PELVIS - 1-2 VIEW; RIGHT FEMUR 2 VIEWS COMPARISON:  None Available. FINDINGS: Bone mineralization within normal limits for patient's age. No acute fracture or dislocation. No aggressive osseous lesion. Visualized sacral arcuate lines are unremarkable. There are mild degenerative changes of bilateral hip joints without significant joint space narrowing. Osteophytosis of the superior acetabulum. Mild degenerative changes of right knee joint asymmetrically involving the medial tibiofemoral compartment. No radiopaque foreign bodies. IMPRESSION: *No acute osseous abnormality of the pelvis or right femur. *Mild degenerative joint disease. Electronically Signed   By: Jules Schick M.D.   On: 04/03/2023 09:50   DG FEMUR, MIN 2 VIEWS RIGHT Result Date: 04/03/2023 CLINICAL DATA:  [id number] Pain 144615. Fall 5 days back. Right leg pain. EXAM: PELVIS - 1-2 VIEW; RIGHT FEMUR 2 VIEWS COMPARISON:  None Available. FINDINGS: Bone mineralization within normal limits for patient's age. No acute fracture or dislocation. No  aggressive osseous lesion. Visualized sacral arcuate lines are unremarkable. There are mild degenerative changes of bilateral hip joints without significant joint space narrowing. Osteophytosis of the superior acetabulum. Mild degenerative changes of right knee joint asymmetrically involving the medial tibiofemoral compartment. No radiopaque foreign bodies. IMPRESSION: *No acute osseous abnormality of the pelvis or right femur. *Mild degenerative joint disease. Electronically Signed   By: Jules Schick M.D.   On: 04/03/2023 09:50    Procedures Procedures    Medications Ordered in ED Medications  diclofenac Sodium (VOLTAREN) 1 % topical gel 2 g (2 g Topical Given 04/03/23 7253)    ED Course/ Medical Decision Making/ A&P                                 Medical Decision Making 89 year old male with past medical history of hypothyroidism and hyperlipidemia presenting to the emergency department today with right hip pain.  I will further evaluate the patient here with an x-ray of his pelvis and right hip as well as his femur given his pain going down.  He has no lumbar spinal tenderness  at this time.  I will reevaluate after the x-ray for ultimate disposition.  The patient's x-rays were unremarkable.  Given his pain a CT scan is ordered.  This is also unremarkable.  He will be discharged with return precautions.  Apparently he did do better after steroids for relatively similar issues a few years ago.  I will prescribe the patient a Medrol Dosepak.  He is given orthopedic follow-up with return precautions.  Amount and/or Complexity of Data Reviewed Radiology: ordered.  Risk Prescription drug management.           Final Clinical Impression(s) / ED Diagnoses Final diagnoses:  Right hip pain    Rx / DC Orders ED Discharge Orders          Ordered    methylPREDNISolone (MEDROL DOSEPAK) 4 MG TBPK tablet        04/03/23 1154              Durwin Glaze, MD 04/03/23 1156
